# Patient Record
Sex: Female | Born: 1965 | Race: Black or African American | Hispanic: No | Marital: Single | Smoking: Never smoker
Health system: Southern US, Community
[De-identification: ages and names within clinical notes are randomized; demographics above are authoritative.]

## PROBLEM LIST (undated history)

## (undated) DIAGNOSIS — T7840XA Allergy, unspecified, initial encounter: Secondary | ICD-10-CM

## (undated) DIAGNOSIS — K219 Gastro-esophageal reflux disease without esophagitis: Secondary | ICD-10-CM

## (undated) HISTORY — PX: CHOLECYSTECTOMY: SHX55

## (undated) HISTORY — PX: GASTRIC BYPASS: SHX52

## (undated) HISTORY — PX: TUBAL LIGATION: SHX77

## (undated) HISTORY — PX: GASTRECTOMY: SHX58

## (undated) HISTORY — PX: ABDOMINAL HYSTERECTOMY: SHX81

## (undated) HISTORY — DX: Gastro-esophageal reflux disease without esophagitis: K21.9

## (undated) HISTORY — DX: Allergy, unspecified, initial encounter: T78.40XA

---

## 1997-11-05 ENCOUNTER — Emergency Department (HOSPITAL_COMMUNITY): Admission: EM | Admit: 1997-11-05 | Discharge: 1997-11-05 | Payer: Self-pay | Admitting: Emergency Medicine

## 1998-09-01 ENCOUNTER — Emergency Department (HOSPITAL_COMMUNITY): Admission: EM | Admit: 1998-09-01 | Discharge: 1998-09-01 | Payer: Self-pay | Admitting: Emergency Medicine

## 1998-09-01 ENCOUNTER — Encounter: Payer: Self-pay | Admitting: Emergency Medicine

## 2004-01-09 ENCOUNTER — Encounter (INDEPENDENT_AMBULATORY_CARE_PROVIDER_SITE_OTHER): Payer: Self-pay | Admitting: Internal Medicine

## 2004-01-19 ENCOUNTER — Ambulatory Visit (HOSPITAL_BASED_OUTPATIENT_CLINIC_OR_DEPARTMENT_OTHER): Admission: RE | Admit: 2004-01-19 | Discharge: 2004-01-19 | Payer: Self-pay | Admitting: Urology

## 2004-01-20 ENCOUNTER — Emergency Department (HOSPITAL_COMMUNITY): Admission: EM | Admit: 2004-01-20 | Discharge: 2004-01-21 | Payer: Self-pay | Admitting: Emergency Medicine

## 2004-10-14 ENCOUNTER — Inpatient Hospital Stay (HOSPITAL_COMMUNITY): Admission: AD | Admit: 2004-10-14 | Discharge: 2004-10-14 | Payer: Self-pay | Admitting: Obstetrics and Gynecology

## 2005-01-30 ENCOUNTER — Encounter: Admission: RE | Admit: 2005-01-30 | Discharge: 2005-01-30 | Payer: Self-pay | Admitting: General Surgery

## 2005-04-08 ENCOUNTER — Ambulatory Visit (HOSPITAL_COMMUNITY): Admission: RE | Admit: 2005-04-08 | Discharge: 2005-04-08 | Payer: Self-pay | Admitting: Surgery

## 2005-05-08 ENCOUNTER — Encounter: Admission: RE | Admit: 2005-05-08 | Discharge: 2005-08-06 | Payer: Self-pay | Admitting: Surgery

## 2005-05-13 ENCOUNTER — Ambulatory Visit (HOSPITAL_COMMUNITY): Admission: RE | Admit: 2005-05-13 | Discharge: 2005-05-13 | Payer: Self-pay | Admitting: Surgery

## 2005-06-26 ENCOUNTER — Emergency Department (HOSPITAL_COMMUNITY): Admission: EM | Admit: 2005-06-26 | Discharge: 2005-06-26 | Payer: Self-pay | Admitting: Emergency Medicine

## 2005-09-01 ENCOUNTER — Ambulatory Visit (HOSPITAL_COMMUNITY): Admission: RE | Admit: 2005-09-01 | Discharge: 2005-09-02 | Payer: Self-pay | Admitting: Surgery

## 2005-10-24 ENCOUNTER — Encounter: Admission: RE | Admit: 2005-10-24 | Discharge: 2005-10-24 | Payer: Self-pay | Admitting: Surgery

## 2006-02-02 ENCOUNTER — Encounter: Admission: RE | Admit: 2006-02-02 | Discharge: 2006-02-02 | Payer: Self-pay | Admitting: Surgery

## 2006-02-18 ENCOUNTER — Encounter: Admission: RE | Admit: 2006-02-18 | Discharge: 2006-02-18 | Payer: Self-pay | Admitting: Surgery

## 2007-09-06 ENCOUNTER — Emergency Department (HOSPITAL_COMMUNITY): Admission: EM | Admit: 2007-09-06 | Discharge: 2007-09-06 | Payer: Self-pay | Admitting: Emergency Medicine

## 2007-09-27 ENCOUNTER — Emergency Department (HOSPITAL_COMMUNITY): Admission: EM | Admit: 2007-09-27 | Discharge: 2007-09-27 | Payer: Self-pay | Admitting: Emergency Medicine

## 2008-01-26 ENCOUNTER — Ambulatory Visit: Payer: Self-pay | Admitting: Nurse Practitioner

## 2008-01-26 DIAGNOSIS — J309 Allergic rhinitis, unspecified: Secondary | ICD-10-CM | POA: Insufficient documentation

## 2008-01-27 ENCOUNTER — Encounter (INDEPENDENT_AMBULATORY_CARE_PROVIDER_SITE_OTHER): Payer: Self-pay | Admitting: Nurse Practitioner

## 2008-02-15 ENCOUNTER — Encounter (INDEPENDENT_AMBULATORY_CARE_PROVIDER_SITE_OTHER): Payer: Self-pay | Admitting: Nurse Practitioner

## 2008-02-25 ENCOUNTER — Ambulatory Visit: Payer: Self-pay | Admitting: Nurse Practitioner

## 2008-02-25 ENCOUNTER — Encounter (INDEPENDENT_AMBULATORY_CARE_PROVIDER_SITE_OTHER): Payer: Self-pay | Admitting: Nurse Practitioner

## 2008-02-25 DIAGNOSIS — N289 Disorder of kidney and ureter, unspecified: Secondary | ICD-10-CM | POA: Insufficient documentation

## 2008-02-25 DIAGNOSIS — R109 Unspecified abdominal pain: Secondary | ICD-10-CM | POA: Insufficient documentation

## 2008-02-25 LAB — CONVERTED CEMR LAB

## 2008-02-26 ENCOUNTER — Encounter (INDEPENDENT_AMBULATORY_CARE_PROVIDER_SITE_OTHER): Payer: Self-pay | Admitting: Nurse Practitioner

## 2008-02-28 ENCOUNTER — Encounter (INDEPENDENT_AMBULATORY_CARE_PROVIDER_SITE_OTHER): Payer: Self-pay | Admitting: Nurse Practitioner

## 2008-02-29 ENCOUNTER — Ambulatory Visit (HOSPITAL_COMMUNITY): Admission: RE | Admit: 2008-02-29 | Discharge: 2008-02-29 | Payer: Self-pay | Admitting: Internal Medicine

## 2008-03-08 ENCOUNTER — Ambulatory Visit (HOSPITAL_COMMUNITY): Admission: RE | Admit: 2008-03-08 | Discharge: 2008-03-08 | Payer: Self-pay | Admitting: Internal Medicine

## 2008-03-16 ENCOUNTER — Encounter: Admission: RE | Admit: 2008-03-16 | Discharge: 2008-03-16 | Payer: Self-pay | Admitting: Internal Medicine

## 2008-03-16 ENCOUNTER — Encounter (INDEPENDENT_AMBULATORY_CARE_PROVIDER_SITE_OTHER): Payer: Self-pay | Admitting: Internal Medicine

## 2008-04-30 ENCOUNTER — Emergency Department (HOSPITAL_COMMUNITY): Admission: EM | Admit: 2008-04-30 | Discharge: 2008-04-30 | Payer: Self-pay | Admitting: Emergency Medicine

## 2008-06-01 ENCOUNTER — Ambulatory Visit: Payer: Self-pay | Admitting: Nurse Practitioner

## 2008-06-02 ENCOUNTER — Encounter (INDEPENDENT_AMBULATORY_CARE_PROVIDER_SITE_OTHER): Payer: Self-pay | Admitting: Nurse Practitioner

## 2008-08-13 ENCOUNTER — Emergency Department (HOSPITAL_COMMUNITY): Admission: EM | Admit: 2008-08-13 | Discharge: 2008-08-13 | Payer: Self-pay | Admitting: Emergency Medicine

## 2009-02-24 ENCOUNTER — Emergency Department (HOSPITAL_COMMUNITY): Admission: EM | Admit: 2009-02-24 | Discharge: 2009-02-24 | Payer: Self-pay | Admitting: Emergency Medicine

## 2009-03-12 ENCOUNTER — Encounter: Admission: RE | Admit: 2009-03-12 | Discharge: 2009-03-12 | Payer: Self-pay | Admitting: *Deleted

## 2009-03-23 ENCOUNTER — Encounter: Admission: RE | Admit: 2009-03-23 | Discharge: 2009-03-23 | Payer: Self-pay | Admitting: *Deleted

## 2009-03-25 ENCOUNTER — Encounter: Admission: RE | Admit: 2009-03-25 | Discharge: 2009-03-25 | Payer: Self-pay | Admitting: *Deleted

## 2009-03-26 ENCOUNTER — Encounter: Admission: RE | Admit: 2009-03-26 | Discharge: 2009-03-26 | Payer: Self-pay | Admitting: *Deleted

## 2009-04-04 ENCOUNTER — Encounter: Admission: RE | Admit: 2009-04-04 | Discharge: 2009-04-04 | Payer: Self-pay | Admitting: *Deleted

## 2009-07-07 ENCOUNTER — Ambulatory Visit (HOSPITAL_COMMUNITY): Admission: RE | Admit: 2009-07-07 | Discharge: 2009-07-07 | Payer: Self-pay | Admitting: Family Medicine

## 2010-04-07 ENCOUNTER — Encounter: Payer: Self-pay | Admitting: *Deleted

## 2010-04-14 LAB — CONVERTED CEMR LAB
ALT: 13 units/L (ref 0–35)
Albumin: 3.7 g/dL (ref 3.5–5.2)
Basophils Absolute: 0 10*3/uL (ref 0.0–0.1)
Blood in Urine, dipstick: NEGATIVE
CO2: 26 meq/L (ref 19–32)
Calcium: 8.8 mg/dL (ref 8.4–10.5)
Chlamydia, DNA Probe: NEGATIVE
Chloride: 107 meq/L (ref 96–112)
Cholesterol: 164 mg/dL (ref 0–200)
Creatinine, Ser: 0.74 mg/dL (ref 0.40–1.20)
GC Probe Amp, Genital: NEGATIVE
Hemoglobin: 13.1 g/dL (ref 12.0–15.0)
Lymphocytes Relative: 35 % (ref 12–46)
Monocytes Absolute: 1.1 10*3/uL — ABNORMAL HIGH (ref 0.1–1.0)
Monocytes Relative: 16 % — ABNORMAL HIGH (ref 3–12)
Neutro Abs: 3.2 10*3/uL (ref 1.7–7.7)
Nitrite: NEGATIVE
Potassium: 4.5 meq/L (ref 3.5–5.3)
Protein, U semiquant: NEGATIVE
RBC: 4.37 M/uL (ref 3.87–5.11)
RDW: 13.5 % (ref 11.5–15.5)
Total CHOL/HDL Ratio: 3.2
WBC Urine, dipstick: NEGATIVE

## 2010-05-21 ENCOUNTER — Emergency Department (HOSPITAL_COMMUNITY)
Admission: EM | Admit: 2010-05-21 | Discharge: 2010-05-21 | Disposition: A | Discharge status: Home / Self Care | Payer: BC Managed Care – PPO | Attending: Emergency Medicine | Admitting: Emergency Medicine

## 2010-05-21 DIAGNOSIS — R51 Headache: Secondary | ICD-10-CM | POA: Insufficient documentation

## 2010-05-21 DIAGNOSIS — R110 Nausea: Secondary | ICD-10-CM | POA: Insufficient documentation

## 2010-06-18 LAB — COMPREHENSIVE METABOLIC PANEL
ALT: 17 U/L (ref 0–35)
AST: 21 U/L (ref 0–37)
Albumin: 4 g/dL (ref 3.5–5.2)
CO2: 28 mEq/L (ref 19–32)
Chloride: 103 mEq/L (ref 96–112)
GFR calc Af Amer: >60 mL/min (ref >60)
GFR calc non Af Amer: >60 mL/min (ref >60)
Potassium: 3.4 mEq/L — ABNORMAL LOW (ref 3.5–5.1)
Sodium: 136 mEq/L (ref 135–145)
Total Bilirubin: 0.6 mg/dL (ref 0.3–1.2)

## 2010-06-18 LAB — URINALYSIS, ROUTINE W REFLEX MICROSCOPIC
Bilirubin Urine: NEGATIVE
Hgb urine dipstick: NEGATIVE
Ketones, ur: NEGATIVE mg/dL
Nitrite: NEGATIVE
Specific Gravity, Urine: 1.009 (ref 1.005–1.030)
Urobilinogen, UA: 0.2 mg/dL (ref 0.0–1.0)
pH: 7 (ref 5.0–8.0)

## 2010-06-18 LAB — DIFFERENTIAL
Basophils Absolute: 0 10*3/uL (ref 0.0–0.1)
Eosinophils Absolute: 0 10*3/uL (ref 0.0–0.7)
Eosinophils Relative: 1 % (ref 0–5)
Monocytes Absolute: 0.8 10*3/uL (ref 0.1–1.0)

## 2010-06-18 LAB — LIPASE, BLOOD: Lipase: 16 U/L (ref 11–59)

## 2010-06-18 LAB — CBC
Platelets: 259 10*3/uL (ref 150–400)
RBC: 4.57 MIL/uL (ref 3.87–5.11)
WBC: 8.3 10*3/uL (ref 4.0–10.5)

## 2010-06-25 LAB — CBC
Hemoglobin: 14.3 g/dL (ref 12.0–15.0)
MCHC: 34 g/dL (ref 30.0–36.0)
MCV: 89.7 fL (ref 78.0–100.0)
RBC: 4.68 MIL/uL (ref 3.87–5.11)
RDW: 12.9 % (ref 11.5–15.5)

## 2010-06-25 LAB — DIFFERENTIAL
Lymphocytes Relative: 31 % (ref 12–46)
Lymphs Abs: 2.7 10*3/uL (ref 0.7–4.0)
Monocytes Relative: 11 % (ref 3–12)
Neutro Abs: 4.9 10*3/uL (ref 1.7–7.7)
Neutrophils Relative %: 57 % (ref 43–77)

## 2010-06-25 LAB — COMPREHENSIVE METABOLIC PANEL
CO2: 27 mEq/L (ref 19–32)
Calcium: 9.6 mg/dL (ref 8.4–10.5)
Creatinine, Ser: 0.65 mg/dL (ref 0.4–1.2)
GFR calc non Af Amer: >60 mL/min (ref >60)
Glucose, Bld: 77 mg/dL (ref 70–99)
Sodium: 139 mEq/L (ref 135–145)
Total Protein: 8 g/dL (ref 6.0–8.3)

## 2010-07-30 NOTE — Consult Note (Signed)
NAME:  Jackie Ellis, Jackie Ellis NO.:  [id number]   MEDICAL RECORD NO.:  [id number]          PATIENT TYPE:  EMS   LOCATION:  ED                           FACILITY:  [facility]   PHYSICIAN:  Juanetta Gosling, MDDATE OF BIRTH:  1965/08/08   DATE OF CONSULTATION:  08/13/2008  DATE OF DISCHARGE:                                 CONSULTATION   REFERRING PHYSICIAN:  Hilario Quarry, MD.   CHIEF COMPLAINT:  Nausea and vomiting.   HISTORY OF PRESENT ILLNESS:  This is a 45 year old female status post  Lap-Band by Dr. Ovidio Kin on September 01, 2005, with good results from  the standpoint of her morbid obesity.  She had her last fill in the  office by Dr. Ezzard Standing about 2 or 3 months ago, was doing well until about  5 days ago.  Since then, she has been unable to eat any solid food and  is only taking sips of liquids currently.  She came in today just  because of her inability to take any volume.  She has also had some  nausea and emesis associated with this.  She is having bowel movements,  passing gas.  Denies any abdominal pain.  Denies any fevers.   PAST MEDICAL HISTORY:  Negative.   PAST SURGICAL HISTORY:  1. Lap-Band.  2. Left ureteral dilatation.   MEDICATIONS:  Claritin.   ALLERGIES:  1. SULFA.  2. CODEINE.  3. PENICILLIN.   PHYSICAL EXAMINATION:  She is afebrile.  She is a well-appearing female  in no distress.  ABDOMEN:  Soft, nontender, nondistended.  Her wounds were all healed  without any evidence of any infectious, her port is palpable.  She has  KUB that shows the band to be in good position, no evidence of any free  air, no other abnormalities.   LABS:  Show a white blood cell count of 8.6, hematocrit 41.9, platelets  of 257, BUN and creatinine are 7 and 0.65, glucose 77.   ASSESSMENT:  Nausea and vomiting status post Lap-Band.   PLAN:  She has not had any antecedent viral illness or anything that  would explain why she might not be tolerating any real oral  intake at  this point.  She has no evidence of an infection, with a normal white  count and afebrile.  She does not have any evidence of band slippage  either, and her port is without any evidence of infection.  She is  really  not currently dehydrated and she feels well now.  I discussed with her  removing some fluid from her band and, under sterile technique, I  removed 3 mL from her port.  She tolerated this well and we will send  her home, on clear liquids, to follow up with Dr. Ezzard Standing or the  bariatric clinic sometime this week.      Juanetta Gosling, MD  Electronically Signed     MCW/MEDQ  D:  08/13/2008  T:  08/13/2008  Job:  [id number]

## 2010-08-02 NOTE — Op Note (Signed)
NAME:  Jackie Ellis, Jackie Ellis             ACCOUNT NO.:  [id number]   MEDICAL RECORD NO.:  [id number]          PATIENT TYPE:  AMB   LOCATION:  ENDO                         FACILITY:  [facility]   PHYSICIAN:  Sandria Bales. Ezzard Standing, M.D.  DATE OF BIRTH:  [birth year]   DATE OF PROCEDURE:  04/08/2005  DATE OF DISCHARGE:                                 OPERATIVE REPORT   CCS NUMBER:  [id number].   PREOPERATIVE DIAGNOSIS:  Constipation, blood in stool.   POSTOPERATIVE DIAGNOSIS:  No obvious mucosal lesion of colon with very  minimal internal hemorrhoidal disease.   PROCEDURE:  Flexible colonoscopy.   SURGEON:  Sandria Bales. Ezzard Standing, M.D.   ANESTHESIA:  Demerol 50 mg, Versed 5 mg.   COMPLICATIONS:  None.   INDICATIONS FOR PROCEDURE:  [name] is a 45 year old black female who has  had increasingly severe constipation and noticed blood in her stool.  Now  comes in for a screening colonoscopy.  She completed a mechanical bowel prep  at home.  She understands the indications and potential complications of the  procedure.  Complications include but are not limited to perforation and  bleeding.   PROCEDURAL NOTE:  With the patient in the left lateral decubitus position,  had an IV in her right wrist.  She was monitored with a pulse oximeter, EKG,  and blood pressure cuff and was on 2 liters nasal O2 during the procedure.  She was given initially 50 mg of Demerol and Versed 4 mg.  She was given an  additional 1 mg of Versed during the procedure.   A flexible Olympus endoscope was passed without difficulty around to her  cecum.  The appendiceal stump was identified.  The ileocecal valve was  identified, as was the fold of the ileocecal valve.  Her right colon,  transverse colon, left colon, sigmoid colon, were actually all unremarkable  for any mucosal lesion or mass.  Subsequently, no diverticular disease.  Within the rectal vault, I did retrospect the scope.  She may have some very  minimal internal hemorrhoids.   Actually, the overall view of the colon was  just totally normal.   I would not recommend another colonoscopy until she turns 50, unless she  should have some change or new symptoms.  She tolerated the procedure well.  Her father was not at the bedside at the end of the procedure.      Sandria Bales. Ezzard Standing, M.D.  Electronically Signed     DHN/MEDQ  D:  04/08/2005  T:  04/08/2005  Job:  [id number]

## 2010-08-02 NOTE — Op Note (Signed)
NAME:  Jackie Ellis             ACCOUNT NO.:  [id number]   MEDICAL RECORD NO.:  [id number]          PATIENT TYPE:  OIB   LOCATION:  1503                         FACILITY:  [facility]   PHYSICIAN:  Sandria Bales. Ezzard Standing, M.D.  DATE OF BIRTH:  11/04/1964   DATE OF PROCEDURE:  09/01/2005  DATE OF DISCHARGE:                                 OPERATIVE REPORT   PREOPERATIVE DIAGNOSIS:  Morbid obesity with a weight of 243, BMI 40.5.   POSTOPERATIVE DIAGNOSIS:  Morbid obesity with a weight of 243, BMI 40.5.   PROCEDURE:  Laparoscopic band procedure with a #10 laparoscopy band.   SURGEON:  Sandria Bales. Ezzard Standing, M.D.   ASSISTANT:  Sharlet Salina T. Hoxworth, M.D.   ANESTHESIA:  General endotracheal.   ESTIMATED BLOOD LOSS:  Minimal.   INDICATIONS FOR PROCEDURE:  [name] is a 45 year old black female who has  been through our bariatric preoperative program which has included medical  evaluation, lab evaluation, psychiatric evaluation, and nutritional  evaluation.  She is interested in a laparoscopic banding procedure and now  comes for laparoscopic banding.   The indications and potential complications of the lap band are explained to  the patient.  Potential complications include, but are not limited to,  bleeding, infection, slippage or erosion of the band, and longterm  nutritional consequences.   DESCRIPTION OF PROCEDURE:  The patient was placed in supine position.  She  was given IV antibiotics preoperatively.  Her abdomen was prepped with  Betadine solution and sterilely draped.   I went through a left subcostal incision and got into the abdominal cavity  with a 10 mm Optiview trocar.  I then placed a left lateral 5 mm trocar, a  left paramedian 10 mm trocar, a right paramedian 10 mm trocar, and a right  subcostal 12 mm trocar, and a 5 mm trocar in her subxiphoid location.   Abdominal exploration revealed right and left lobes of the liver  unremarkable, gallbladder unremarkable, anterior wall  of the stomach  unremarkable.  The bowel which I could see was unremarkable.   I positioned the Nathanson retractor under the left lobe of the liver and  identified the gastroesophageal junction.  I first opened up the angle of  Hiss, and then went under the lesser curvature and found where the left and  right crura and passed the finger dissector behind the esophageal gastric  junction.   This passed without difficulty.  I then had them pass the lap band sizer  down and this was passed into the stomach without difficulty.  The lap band  sizer was inflated to 15 mL and pulled back snugly at the GE junction.  There was no evidence of hiatal hernia either externally or where the sizer  pulled back.   I then cinched the band down, removed the sizer without difficulty, and I  imbricated the stomach over the band using three separate sutures along the  gastrogastric plication.  I used 2-0 Ethibond suture and used the laparotie  on the stitch.   After three stitches, I felt we imbricated the band well.  It looked  like it  was in good location.  I did take photos of it.  There was no  bleeding  around the band.  The band tubing was removed through the right paramedian  incision.  The trocars were then all removed in turn.  There was no bleeding  at any trocar site.   I then enlarged the incision where the trocar came on the right paramedian  incision.  I sewed the reservoir down to the fascia using interrupted 2-0  Prolene sutures.  I attached the band to the Silastic tubing device to the  reservoir and inserted this into the abdominal cavity.   The band lay flat.  The tubing went well into the abdomen.  I then closed  the subcutaneous tissues with 3-0 Vicryl and the skin at each site with a 5-  0 Monocryl suture.  I painted the wound with tincture of Benzoin and Steri-  Strips.   The patient tolerated the procedure well.  Estimated blood loss was minimal.  Needle, sponge, and  instrument counts correct at the end of the case.   I used a #10 band in.  The reference number was [id number].  The S/N number was  [id number] and this was a laparoscopic adjustable gastric band.      Sandria Bales. Ezzard Standing, M.D.  Electronically Signed     DHN/MEDQ  D:  09/01/2005  T:  09/02/2005  Job:  [id number]   cc:   Jonita Albee, M.D.  Fax: [phone]

## 2010-08-02 NOTE — Op Note (Signed)
NAME:  Jackie Ellis, ARTS             ACCOUNT NO.:  [id number]   MEDICAL RECORD NO.:  [id number]          PATIENT TYPE:  AMB   LOCATION:  NESC                         FACILITY:  [facility]   PHYSICIAN:  Maretta Bees. Vonita Moss, M.D.DATE OF BIRTH:  10-Sep-1965   DATE OF PROCEDURE:  01/19/2004  DATE OF DISCHARGE:                                 OPERATIVE REPORT   PREOPERATIVE DIAGNOSES:  1.  Left lower quadrant pain.  2.  Left hydronephrosis.  3.  Suspected left ureteral calculus.   POSTOPERATIVE DIAGNOSES:  1.  Left lower quadrant pain.  2.  Left hydronephrosis.  3.  Mild distal ureteral stricture.   PROCEDURES:  1.  Cystoscopy.  2.  Left retrograde pyelogram with interpretation.  3.  Left ureteroscopy.  4.  Balloon dilation of left ureteral stricture.  5.  Placement of left double-J catheter.   SURGEON:  Dr. Larey Dresser   ANESTHESIA:  General.   INDICATIONS:  This 45 year old black female has had 6 weeks of left lower  quadrant pain and had a CT scan that showed mild left ureteral obstruction  and question of a small distal left ureteral calculus.  No other  abnormalities were seen on this abdominal pelvic CT scan done at  [facility].  She is brought to the OR today because she has had  6 weeks of pain and requests intervention.  I talked to her about  cystoscopy, ureteroscopy, and stone manipulation.   DESCRIPTION OF PROCEDURE:  The patient was brought to the operating room and  placed in lithotomy position.  External genitalia were prepped and draped in  the usual fashion.  She was cystoscoped, and the bladder was normal.  The  ureteral orifices looked on the small side.  A guidewire was placed up the  distal left ureter and adjacent to that, I saw a calcification that seemed  to be close and moved with the wire, but I was not convinced it was a stone.  I had to do a balloon dilation at the left intramural ureter to allow  ureteroscopy with a 6 French rigid  ureteroscope.  Just below the level of  the iliac vessels, there was a narrow area of the ureter that I could not  negotiate with the ureteroscope, so I did a balloon dilation for 3 minutes  at 12 atmospheres of pressure of this area, and that allowed the  ureteroscope to bypass this area without difficulty and observe the ureter  all the way to the total length of the scope with no evidence of stones or  other lesions.  Left retrograde pyelogram showed some mild left  hydronephrosis.  I looked my way out with the ureteroscope, and there seemed  to be very minimal disruption of the ureteral mucosa, and I was thinking  that double-J catheter would not be necessary, but then I injected contrast  while pulling out an open-ended ureteral catheter, and there was some  periureteral extravasation near the tight area that I dilated in the pelvic  region.  Therefore, I re-placed a metal guidewire into the renal collecting  system without difficulty and  over the guidewire placed a 6 French 26 cm  double-J catheter with the string off that coiled fully in the renal  collecting system and a full coil in the bladder.  The bladder was emptied,  scope removed, and the patient sent to the recovery room in good condition  having tolerated the procedure well.  She was given a B&O suppository and 30  mg of Toradol IV and will be sent home on pain medications, Detrol LA as  needed, and 2 more days of Levaquin.      LJP/MEDQ  D:  01/19/2004  T:  01/19/2004  Job:  [id number]   cc:   Jonita Albee, M.D.  [facility]  [address]  [place]  [place] [address]  Fax: [phone]

## 2010-12-12 LAB — RAPID STREP SCREEN (MED CTR MEBANE ONLY): Streptococcus, Group A Screen (Direct): NEGATIVE

## 2011-06-08 IMAGING — CR DG ABDOMEN 2V
2 series · 2 of 2 positions shown · non-contrast
Comparison: 08/13/2008.

CLINICAL DATA: Nausea and vomiting.  Epigastric pain.  Gastric lap
banding 3 years ago.

ABDOMEN - 2 VIEW

[w abdomen upright]
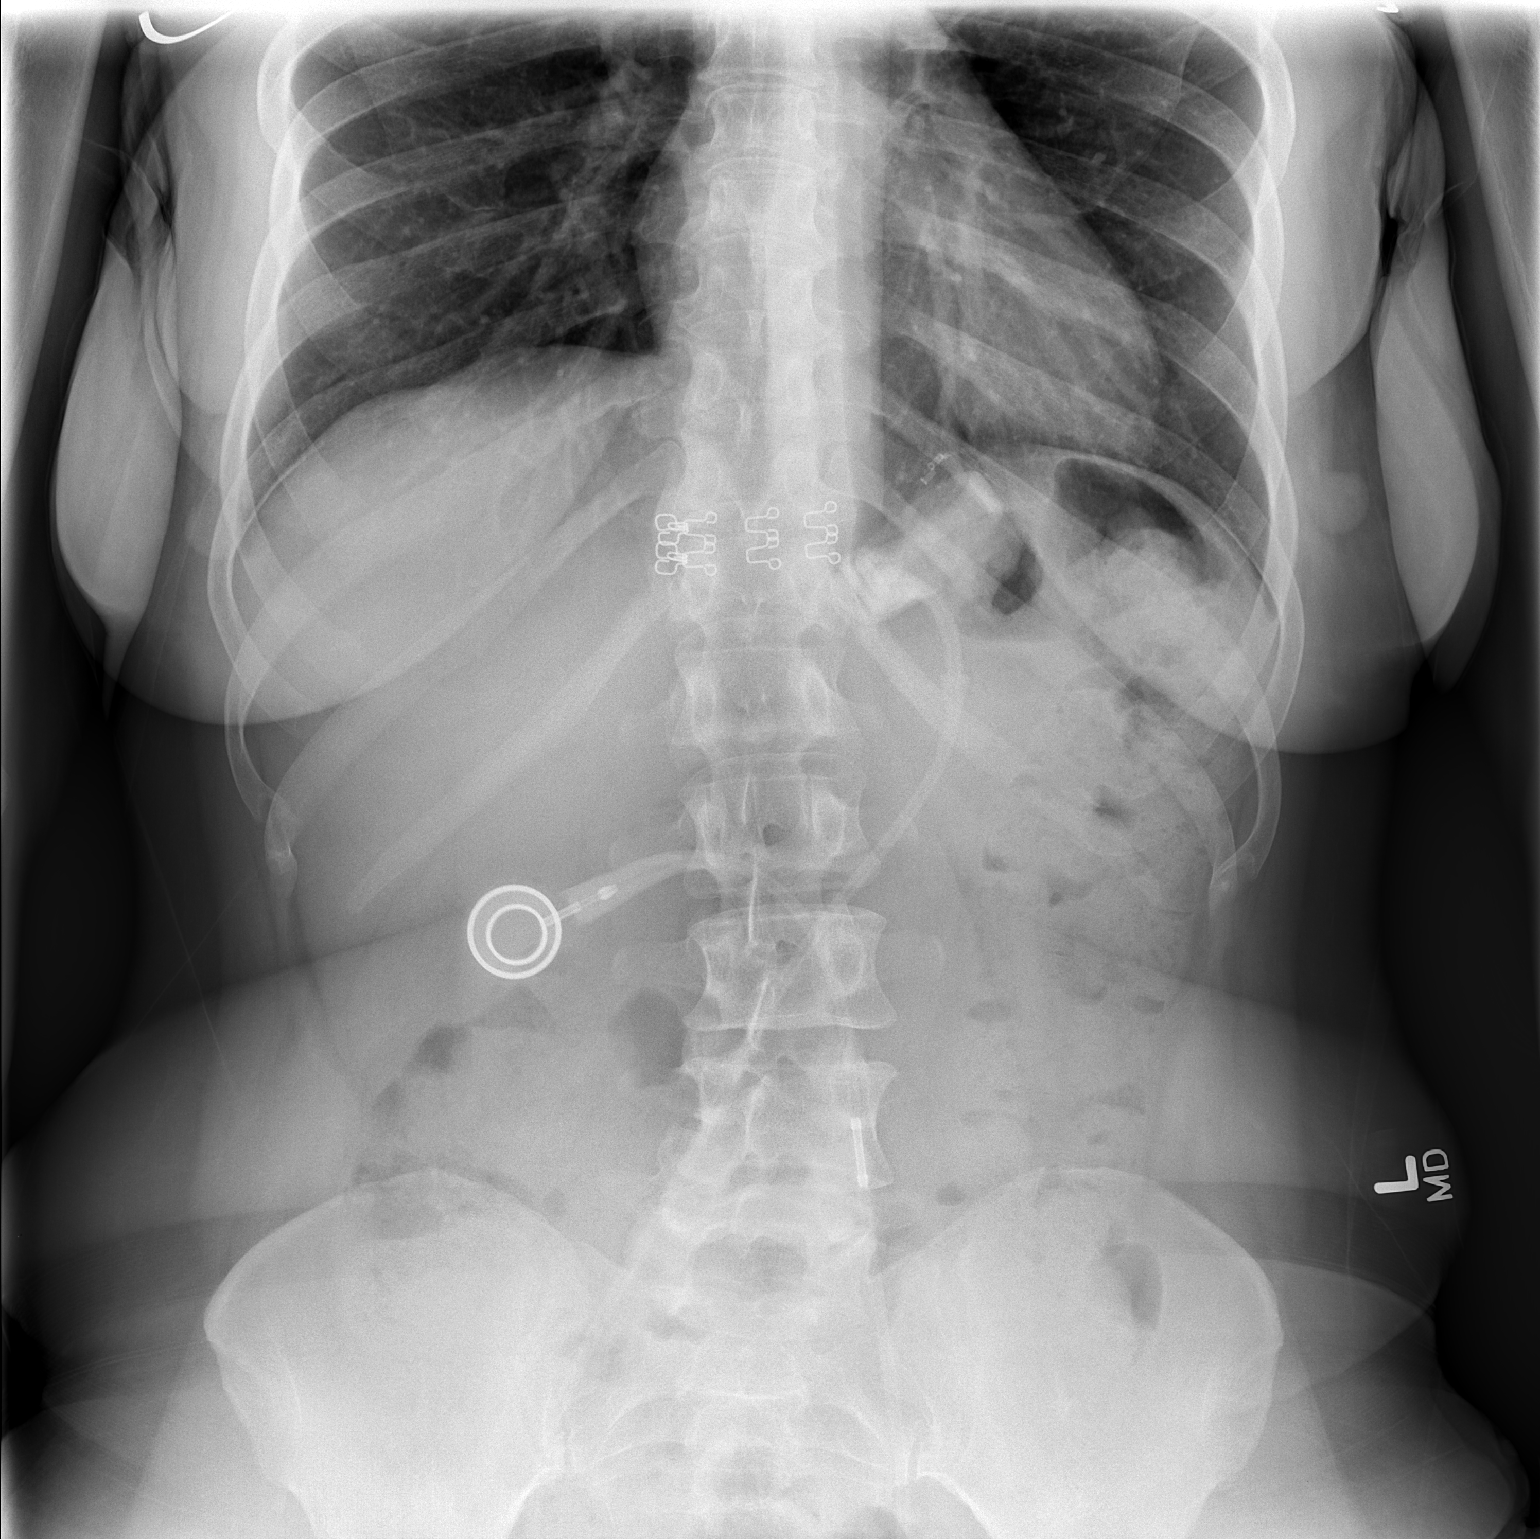

[t abdomen supine]
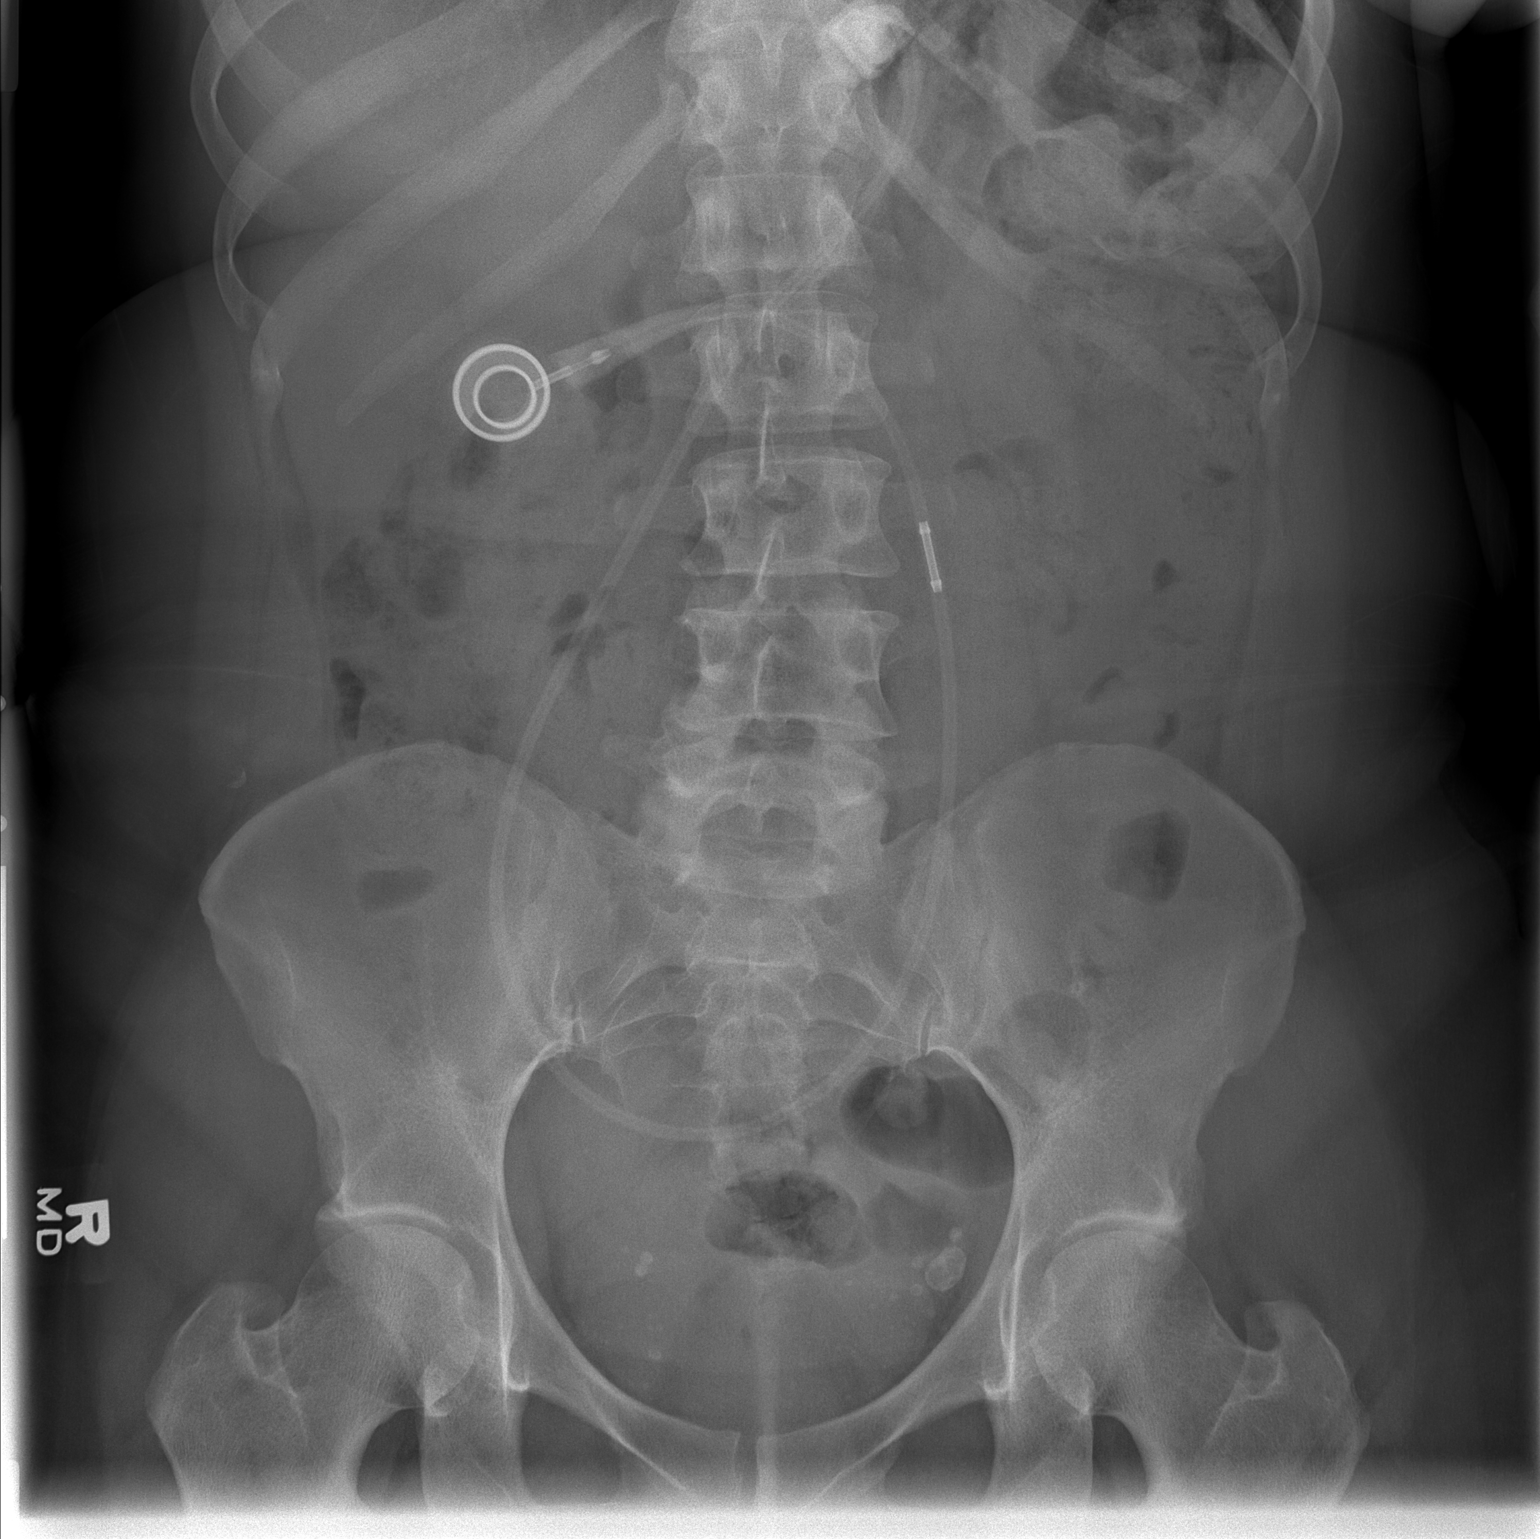

[2 of 2 positions shown; findings below may reference images not displayed]

FINDINGS: Lap band position and orientation are unchanged.  The
tubing is intact.  The bowel gas pattern is normal.  Stool is
present throughout the colon.  There is no free intraperitoneal
air.  Pelvic phleboliths appear stable.
IMPRESSION: No active abdominal process.  Lap band appears stable.

## 2012-03-08 DIAGNOSIS — M84376A Stress fracture, unspecified foot, initial encounter for fracture: Secondary | ICD-10-CM | POA: Insufficient documentation

## 2015-03-20 DIAGNOSIS — F419 Anxiety disorder, unspecified: Secondary | ICD-10-CM | POA: Insufficient documentation

## 2015-04-23 ENCOUNTER — Ambulatory Visit (INDEPENDENT_AMBULATORY_CARE_PROVIDER_SITE_OTHER): Payer: BLUE CROSS/BLUE SHIELD | Admitting: Physician Assistant

## 2015-04-23 VITALS — BP 124/88 | HR 89 | Temp 99.4°F | Resp 18 | Ht 65.0 in | Wt 262.2 lb

## 2015-04-23 DIAGNOSIS — J0141 Acute recurrent pansinusitis: Secondary | ICD-10-CM | POA: Diagnosis not present

## 2015-04-23 DIAGNOSIS — J0140 Acute pansinusitis, unspecified: Secondary | ICD-10-CM | POA: Diagnosis not present

## 2015-04-23 MED ORDER — FLUCONAZOLE 150 MG PO TABS: 150.0000 mg | ORAL_TABLET | Freq: Once | ORAL | Status: DC

## 2015-04-23 MED ORDER — OXYMETAZOLINE HCL 0.05 % NA SOLN: 1.0000 | Freq: Two times a day (BID) | NASAL | Status: DC

## 2015-04-23 MED ORDER — GUAIFENESIN ER 1200 MG PO TB12: 1.0000 | ORAL_TABLET | Freq: Two times a day (BID) | ORAL | Status: DC | PRN

## 2015-04-23 MED ORDER — LEVOFLOXACIN 750 MG PO TABS: 750.0000 mg | ORAL_TABLET | Freq: Every day | ORAL | Status: DC

## 2015-04-23 MED ORDER — HYDROCOD POLST-CPM POLST ER 10-8 MG/5ML PO SUER: 5.0000 mL | Freq: Every evening | ORAL | Status: DC | PRN

## 2015-04-23 NOTE — Progress Notes (Signed)
[facility] [address], [place] [address] [id number]  Date:  04/23/2015   Name:  Jackie Ellis   DOB:  16-Jan-1966   MRN:  [id number]  PCP:  No primary care provider on file.   History of Present Illness:  Jackie Ellis is a 50 y.o. female patient who presents to [facility] for cc of body aches, nasal congestion, and cough. Patient reports sinus congestion with facial pain for about 3 days.  She became concerned when she had fever of 102 this morning at 4 am.  She took tylenol which helped.  She has aching and headache.  Dry cough.  She has nasal congestion with thick yellow green mucus.  Facial pain.  No nose bleeds.  Sore throat.  She is drinking a lot of water.   No sob or dyspnea. She attempted Nyquil and tylenol.  She did Flonase and mucinex this morning as well.   This is the 4th sinus infection since 4 months ago.  She was seen at ENT who gave her a cephalosporin and prednisone on separate occasions.  She has had fever in the past with this infection as well.  She was advised to have a sinuplasty, but she is reluctant.  She just moved back to [place], and does not have a specialist here.   There are no active problems to display for this patient.   Past Medical History  Diagnosis Date  . Allergy   . GERD (gastroesophageal reflux disease)     Past Surgical History  Procedure Laterality Date  . Tubal ligation    . Abdominal hysterectomy    . Tubal ligation      Social History  Substance Use Topics  . Smoking status: Never Smoker   . Smokeless tobacco: None  . Alcohol Use: No    Family History  Problem Relation Age of Onset  . Diabetes Mother   . Hypertension Mother   . Diabetes Father   . Hypertension Father   . Cancer Sister   . Diabetes Brother   . Hypertension Brother   . Hyperlipidemia Brother     Allergies  Allergen Reactions  . Codeine Rash  . Penicillins Rash    Medication list has been reviewed and  updated.  No current outpatient prescriptions on file prior to visit.   No current facility-administered medications on file prior to visit.    ROS ROS otherwise unremarkable unless listed above.   Physical Examination: BP 124/88 mmHg  Pulse 89  Temp(Src) 99.4 F (37.4 C) (Oral)  Resp 18  Ht  (1.651 m)  Wt 262 lb 3.2 oz (118.933 kg)  BMI 43.63 kg/m2  SpO2 99% Ideal Body Weight: Weight in (lb) to have BMI = 25: 149.9  Physical Exam  Constitutional: She is oriented to person, place, and time. She appears well-developed and well-nourished. No distress.  HENT:  Head: Normocephalic and atraumatic.  Right Ear: Tympanic membrane, external ear and ear canal normal.  Left Ear: Tympanic membrane, external ear and ear canal normal.  Nose: Mucosal edema and rhinorrhea (purulent) present. Right sinus exhibits maxillary sinus tenderness and frontal sinus tenderness. Left sinus exhibits maxillary sinus tenderness and frontal sinus tenderness.  Mouth/Throat: No uvula swelling. No oropharyngeal exudate, posterior oropharyngeal edema or posterior oropharyngeal erythema.  Eyes: Conjunctivae and EOM are normal. Pupils are equal, round, and reactive to light.  Cardiovascular: Normal rate and regular rhythm.  Exam reveals no gallop, no distant heart sounds and no friction  rub.   No murmur heard. Pulmonary/Chest: Effort normal. No respiratory distress. She has no decreased breath sounds. She has no wheezes. She has no rhonchi.  Lymphadenopathy:       Head (right side): No submandibular, no tonsillar, no preauricular and no posterior auricular adenopathy present.       Head (left side): No submandibular, no tonsillar, no preauricular and no posterior auricular adenopathy present.    She has cervical adenopathy.       Right cervical: Superficial cervical adenopathy present.       Left cervical: Superficial cervical adenopathy present.  Neurological: She is alert and oriented to person, place, and  time.  Skin: She is not diaphoretic.  Psychiatric: She has a normal mood and affect. Her behavior is normal.     Assessment and Plan: Jackie Ellis is a 50 y.o. female who is here today for cc of sinus pain, fever, and cough.   --will guard for bacteria at this time.  This is recurrent with patient since October with 4 episodes and abx use.  She states that she generally is put on prednisone with abx.  With high fever, I am reluctant to do this.  Given Levaquin--and placed for ENT consult (thank you).    Subacute pansinusitis - Plan: chlorpheniramine-HYDROcodone (TUSSIONEX PENNKINETIC ER) 10-8 MG/5ML SUER, Guaifenesin (MUCINEX MAXIMUM STRENGTH) 1200 MG TB12, levofloxacin (LEVAQUIN) 750 MG tablet, fluconazole (DIFLUCAN) 150 MG tablet, oxymetazoline (AFRIN) 0.05 % nasal spray, Ambulatory referral to ENT  Acute recurrent pansinusitis - Plan: Ambulatory referral to ENT  Trena Platt, PA-C [facility] 2/6/20179:18 AM

## 2015-04-23 NOTE — Patient Instructions (Signed)
Please hydrate well with water 64 oz per day. Please take the afrin for only 3 days. Please await contact for ENT referral.   Sinusitis, Adult Sinusitis is redness, soreness, and inflammation of the paranasal sinuses. Paranasal sinuses are air pockets within the bones of your face. They are located beneath your eyes, in the middle of your forehead, and above your eyes. In healthy paranasal sinuses, mucus is able to drain out, and air is able to circulate through them by way of your nose. However, when your paranasal sinuses are inflamed, mucus and air can become trapped. This can allow bacteria and other germs to grow and cause infection. Sinusitis can develop quickly and last only a short time (acute) or continue over a long period (chronic). Sinusitis that lasts for more than 12 weeks is considered chronic. CAUSES Causes of sinusitis include:  Allergies.  Structural abnormalities, such as displacement of the cartilage that separates your nostrils (deviated septum), which can decrease the air flow through your nose and sinuses and affect sinus drainage.  Functional abnormalities, such as when the small hairs (cilia) that line your sinuses and help remove mucus do not work properly or are not present. SIGNS AND SYMPTOMS Symptoms of acute and chronic sinusitis are the same. The primary symptoms are pain and pressure around the affected sinuses. Other symptoms include:  Upper toothache.  Earache.  Headache.  Bad breath.  Decreased sense of smell and taste.  A cough, which worsens when you are lying flat.  Fatigue.  Fever.  Thick drainage from your nose, which often is green and may contain pus (purulent).  Swelling and warmth over the affected sinuses. DIAGNOSIS Your health care provider will perform a physical exam. During your exam, your health care provider may perform any of the following to help determine if you have acute sinusitis or chronic sinusitis:  Look in your nose  for signs of abnormal growths in your nostrils (nasal polyps).  Tap over the affected sinus to check for signs of infection.  View the inside of your sinuses using an imaging device that has a light attached (endoscope). If your health care provider suspects that you have chronic sinusitis, one or more of the following tests may be recommended:  Allergy tests.  Nasal culture. A sample of mucus is taken from your nose, sent to a lab, and screened for bacteria.  Nasal cytology. A sample of mucus is taken from your nose and examined by your health care provider to determine if your sinusitis is related to an allergy. TREATMENT Most cases of acute sinusitis are related to a viral infection and will resolve on their own within 10 days. Sometimes, medicines are prescribed to help relieve symptoms of both acute and chronic sinusitis. These may include pain medicines, decongestants, nasal steroid sprays, or saline sprays. However, for sinusitis related to a bacterial infection, your health care provider will prescribe antibiotic medicines. These are medicines that will help kill the bacteria causing the infection. Rarely, sinusitis is caused by a fungal infection. In these cases, your health care provider will prescribe antifungal medicine. For some cases of chronic sinusitis, surgery is needed. Generally, these are cases in which sinusitis recurs more than 3 times per year, despite other treatments. HOME CARE INSTRUCTIONS  Drink plenty of water. Water helps thin the mucus so your sinuses can drain more easily.  Use a humidifier.  Inhale steam 3-4 times a day (for example, sit in the bathroom with the shower running).  Apply a  warm, moist washcloth to your face 3-4 times a day, or as directed by your health care provider.  Use saline nasal sprays to help moisten and clean your sinuses.  Take medicines only as directed by your health care provider.  If you were prescribed either an antibiotic  or antifungal medicine, finish it all even if you start to feel better. SEEK IMMEDIATE MEDICAL CARE IF:  You have increasing pain or severe headaches.  You have nausea, vomiting, or drowsiness.  You have swelling around your face.  You have vision problems.  You have a stiff neck.  You have difficulty breathing.   This information is not intended to replace advice given to you by your health care provider. Make sure you discuss any questions you have with your health care provider.   Document Released: 03/03/2005 Document Revised: 03/24/2014 Document Reviewed: 03/18/2011 Elsevier Interactive Patient Education [employer].

## 2015-06-04 ENCOUNTER — Encounter (HOSPITAL_BASED_OUTPATIENT_CLINIC_OR_DEPARTMENT_OTHER): Payer: Self-pay | Admitting: Emergency Medicine

## 2015-06-04 ENCOUNTER — Emergency Department (HOSPITAL_BASED_OUTPATIENT_CLINIC_OR_DEPARTMENT_OTHER)
Admission: EM | Admit: 2015-06-04 | Discharge: 2015-06-05 | Disposition: A | Discharge status: Home / Self Care | Payer: BLUE CROSS/BLUE SHIELD | Attending: Emergency Medicine | Admitting: Emergency Medicine

## 2015-06-04 ENCOUNTER — Emergency Department (HOSPITAL_BASED_OUTPATIENT_CLINIC_OR_DEPARTMENT_OTHER): Payer: BLUE CROSS/BLUE SHIELD

## 2015-06-04 DIAGNOSIS — Z8719 Personal history of other diseases of the digestive system: Secondary | ICD-10-CM | POA: Insufficient documentation

## 2015-06-04 DIAGNOSIS — R0789 Other chest pain: Secondary | ICD-10-CM | POA: Diagnosis not present

## 2015-06-04 DIAGNOSIS — Z880 Allergy status to penicillin: Secondary | ICD-10-CM | POA: Diagnosis not present

## 2015-06-04 DIAGNOSIS — Z792 Long term (current) use of antibiotics: Secondary | ICD-10-CM | POA: Diagnosis not present

## 2015-06-04 DIAGNOSIS — R079 Chest pain, unspecified: Secondary | ICD-10-CM | POA: Diagnosis present

## 2015-06-04 LAB — CBC WITH DIFFERENTIAL/PLATELET
Basophils Absolute: 0 10*3/uL (ref 0.0–0.1)
Basophils Relative: 0 %
EOS ABS: 0.3 10*3/uL (ref 0.0–0.7)
Eosinophils Relative: 2 %
HEMATOCRIT: 35.4 % — AB (ref 36.0–46.0)
HEMOGLOBIN: 11.9 g/dL — AB (ref 12.0–15.0)
Lymphocytes Relative: 38 %
Lymphs Abs: 4.8 10*3/uL — ABNORMAL HIGH (ref 0.7–4.0)
MCH: 27.4 pg (ref 26.0–34.0)
MCHC: 33.6 g/dL (ref 30.0–36.0)
MCV: 81.4 fL (ref 78.0–100.0)
Monocytes Absolute: 1.5 10*3/uL — ABNORMAL HIGH (ref 0.1–1.0)
Monocytes Relative: 11 %
NEUTROS ABS: 6.3 10*3/uL (ref 1.7–7.7)
NEUTROS PCT: 49 %
Platelets: 327 10*3/uL (ref 150–400)
RBC: 4.35 MIL/uL (ref 3.87–5.11)
RDW: 13.7 % (ref 11.5–15.5)
WBC: 12.8 10*3/uL — ABNORMAL HIGH (ref 4.0–10.5)

## 2015-06-04 LAB — BASIC METABOLIC PANEL
Anion gap: 7 (ref 5–15)
BUN: 17 mg/dL (ref 6–20)
CHLORIDE: 105 mmol/L (ref 101–111)
CO2: 26 mmol/L (ref 22–32)
CREATININE: 0.77 mg/dL (ref 0.44–1.00)
Calcium: 9.5 mg/dL (ref 8.9–10.3)
GFR calc Af Amer: >60 mL/min (ref >60)
GFR calc non Af Amer: >60 mL/min (ref >60)
Glucose, Bld: 102 mg/dL — ABNORMAL HIGH (ref 65–99)
POTASSIUM: 3.3 mmol/L — AB (ref 3.5–5.1)
SODIUM: 138 mmol/L (ref 135–145)

## 2015-06-04 LAB — TROPONIN I

## 2015-06-04 NOTE — ED Provider Notes (Signed)
CSN: [id number]     Arrival date & time 06/04/15  2041 History  By signing my name below, I, Budd Palmer, attest that this documentation has been prepared under the direction and in the presence of Paula Libra, MD. Electronically Signed: Budd Palmer, ED Scribe. 06/04/2015. 11:20 PM.     Chief Complaint  Patient presents with  . Chest Pain   The history is provided by the patient. No language interpreter was used.   HPI Comments: Jackie Ellis is a 50 y.o. female who presents to the Emergency Department complaining of intermittent, left-sided chest pain onset 10 AM this morning, lasting for about a minute at a time. Pt states it feels as though she is having "spasms in [her] chest." She notes the pains first occurred every hour, but then increased to once every 20 minutes as of 5 PM this evening. She feels she has to catch her breath after a sharp intake with each episode. She reports associated upper-left chest wall muscle soreness. She notes a FHx of heart-disease, HTN, DM, and kidney disease. Pt denies n/v/d and diaphoresis. She admits she started using a new energy drink yesterday.  Past Medical History  Diagnosis Date  . Allergy   . GERD (gastroesophageal reflux disease)    Past Surgical History  Procedure Laterality Date  . Tubal ligation    . Abdominal hysterectomy    . Tubal ligation     Family History  Problem Relation Age of Onset  . Diabetes Mother   . Hypertension Mother   . Diabetes Father   . Hypertension Father   . Cancer Sister   . Diabetes Brother   . Hypertension Brother   . Hyperlipidemia Brother    Social History  Substance Use Topics  . Smoking status: Never Smoker   . Smokeless tobacco: None  . Alcohol Use: No   OB History    No data available     Review of Systems  All other systems reviewed and are negative.   Allergies  Codeine and Penicillins  Home Medications   Prior to Admission medications   Medication Sig Start Date  End Date Taking? Authorizing Provider  chlorpheniramine-HYDROcodone (TUSSIONEX PENNKINETIC ER) 10-8 MG/5ML SUER Take 5 mLs by mouth at bedtime as needed. 04/23/15   Collie Siad English, PA  fluconazole (DIFLUCAN) 150 MG tablet Take 1 tablet (150 mg total) by mouth once. Repeat if needed 04/23/15   Collie Siad English, PA  Guaifenesin [facility] MAXIMUM STRENGTH) 1200 MG TB12 Take 1 tablet (1,200 mg total) by mouth every 12 (twelve) hours as needed. 04/23/15   Collie Siad English, PA  levofloxacin (LEVAQUIN) 750 MG tablet Take 1 tablet (750 mg total) by mouth daily. 04/23/15   Collie Siad English, PA  oxymetazoline (AFRIN) 0.05 % nasal spray Place 1 spray into both nostrils 2 (two) times daily. DO NOT USE LONGER THAN 3 DAYS. 04/23/15   Collie Siad English, PA   BP 134/80 mmHg  Pulse 70  Temp(Src) 98.5 F (36.9 C) (Oral)  Resp 18  Ht  (1.651 m)  Wt 260 lb (117.935 kg)  BMI 43.27 kg/m2  SpO2 100% Physical Exam General: Well-developed, well-nourished female in no acute distress; appearance consistent with age of record HENT: normocephalic; atraumatic Eyes: pupils equal, round and reactive to light; extraocular muscles intact Neck: supple Heart: regular rate and rhythm; no murmurs, rubs or gallops Lungs: clear to auscultation bilaterally Chest: Left upper chest wall TTP, different from the pain described in the chief  complaint Abdomen: soft; nondistended; nontender; no masses or hepatosplenomegaly; bowel sounds present Extremities: No deformity; full range of motion; pulses normal Neurologic: Awake, alert and oriented; motor function intact in all extremities and symmetric; no facial droop Skin: Warm and dry Psychiatric: Normal mood and affect   ED Course  Procedures   MDM   Nursing notes and vitals signs, including pulse oximetry, reviewed.  Summary of this visit's results, reviewed by myself:   EKG Interpretation  Date/Time:  Monday June 04 2015 20:52:09 EDT Ventricular Rate:   69 PR Interval:  152 QRS Duration: 80 QT Interval:  406 QTC Calculation: 435 R Axis:   65 Text Interpretation:  Normal sinus rhythm Cannot rule out Anterior infarct , age undetermined Abnormal ECG Confirmed by BEATON  MD, ROBERT ([id number]) on 06/04/2015 10:21:07 PM       Labs:  Results for orders placed or performed during the hospital encounter of 06/04/15 (from the past 24 hour(s))  CBC with Differential/Platelet     Status: Abnormal   Collection Time: 06/04/15 11:20 PM  Result Value Ref Range   WBC 12.8 (H) 4.0 - 10.5 K/uL   RBC 4.35 3.87 - 5.11 MIL/uL   Hemoglobin 11.9 (L) 12.0 - 15.0 g/dL   HCT 16.135.4 (L) 09.636.0 - 04.546.0 %   MCV 81.4 78.0 - 100.0 fL   MCH 27.4 26.0 - 34.0 pg   MCHC 33.6 30.0 - 36.0 g/dL   RDW 40.913.7 81.111.5 - 91.415.5 %   Platelets 327 150 - 400 K/uL   Neutrophils Relative % 49 %   Neutro Abs 6.3 1.7 - 7.7 K/uL   Lymphocytes Relative 38 %   Lymphs Abs 4.8 (H) 0.7 - 4.0 K/uL   Monocytes Relative 11 %   Monocytes Absolute 1.5 (H) 0.1 - 1.0 K/uL   Eosinophils Relative 2 %   Eosinophils Absolute 0.3 0.0 - 0.7 K/uL   Basophils Relative 0 %   Basophils Absolute 0.0 0.0 - 0.1 K/uL  Basic metabolic panel     Status: Abnormal   Collection Time: 06/04/15 11:20 PM  Result Value Ref Range   Sodium 138 135 - 145 mmol/L   Potassium 3.3 (L) 3.5 - 5.1 mmol/L   Chloride 105 101 - 111 mmol/L   CO2 26 22 - 32 mmol/L   Glucose, Bld 102 (H) 65 - 99 mg/dL   BUN 17 6 - 20 mg/dL   Creatinine, Ser 7.820.77 0.44 - 1.00 mg/dL   Calcium 9.5 8.9 - 95.610.3 mg/dL   GFR calc non Af Amer >60 >60 mL/min   GFR calc Af Amer >60 >60 mL/min   Anion gap 7 5 - 15  Troponin I     Status: None   Collection Time: 06/04/15 11:20 PM  Result Value Ref Range   Troponin I <0.03 <0.031 ng/mL    Imaging Studies: Dg Chest 2 View  06/04/2015  CLINICAL DATA:  Left-sided chest spasms in some left lower chest pain with shortness of breath today. EXAM: CHEST  2 VIEW COMPARISON:  None. FINDINGS: The cardiomediastinal  silhouette is within normal limits. The lungs are well inflated and clear. There is no evidence of pleural effusion or pneumothorax. No acute osseous abnormality is identified. IMPRESSION: No active cardiopulmonary disease. Electronically Signed   By: Sebastian AcheAllen  Grady M.D.   On: 06/04/2015 23:50   1:06 AM Patient feels better after GI cocktail. She said consistent on leaving because she has to be at work in the morning. Her pain is atypical  for cardiac etiology. She has a follow-up appointment with the gastroenterologist scheduled.   Paula Libra, MD 06/05/15 [phone]

## 2015-06-04 NOTE — ED Notes (Signed)
Patient states that she had had a chest spasm to her left chest all day. She reports that she had it recent at 2020 and 2043

## 2015-06-04 NOTE — ED Notes (Signed)
MD at bedside. 

## 2015-06-04 NOTE — ED Notes (Signed)
Pt to desk stating she wants to leave due to wait. Pt made aware that MD was on his way to see her now. Dr. Read DriversMolpus aware. Apologized for wait and pt returned to room.

## 2015-06-05 MED ORDER — GI COCKTAIL ~~LOC~~
30.0000 mL | Freq: Once | ORAL | Status: AC
  Administered 2015-06-05: 30 mL via ORAL
  Filled 2015-06-05: qty 30

## 2015-06-05 NOTE — ED Notes (Signed)
MD at bedside. 

## 2015-06-05 NOTE — ED Notes (Signed)
Pt ambulatory to bathroom without difficulty.  

## 2015-07-18 DIAGNOSIS — K9509 Other complications of gastric band procedure: Secondary | ICD-10-CM | POA: Insufficient documentation

## 2015-07-18 DIAGNOSIS — M1710 Unilateral primary osteoarthritis, unspecified knee: Secondary | ICD-10-CM | POA: Insufficient documentation

## 2015-07-18 DIAGNOSIS — M179 Osteoarthritis of knee, unspecified: Secondary | ICD-10-CM | POA: Insufficient documentation

## 2015-07-18 DIAGNOSIS — Z9884 Bariatric surgery status: Secondary | ICD-10-CM | POA: Insufficient documentation

## 2015-07-21 DIAGNOSIS — Z803 Family history of malignant neoplasm of breast: Secondary | ICD-10-CM | POA: Insufficient documentation

## 2015-07-31 DIAGNOSIS — G43909 Migraine, unspecified, not intractable, without status migrainosus: Secondary | ICD-10-CM | POA: Insufficient documentation

## 2015-09-19 DIAGNOSIS — K2100 Gastro-esophageal reflux disease with esophagitis, without bleeding: Secondary | ICD-10-CM | POA: Insufficient documentation

## 2015-10-05 ENCOUNTER — Encounter: Payer: Self-pay | Admitting: Physician Assistant

## 2015-10-05 DIAGNOSIS — K219 Gastro-esophageal reflux disease without esophagitis: Secondary | ICD-10-CM | POA: Insufficient documentation

## 2015-11-15 DIAGNOSIS — Z9884 Bariatric surgery status: Secondary | ICD-10-CM | POA: Diagnosis not present

## 2015-11-15 DIAGNOSIS — K210 Gastro-esophageal reflux disease with esophagitis: Secondary | ICD-10-CM | POA: Diagnosis not present

## 2015-12-05 DIAGNOSIS — K912 Postsurgical malabsorption, not elsewhere classified: Secondary | ICD-10-CM | POA: Insufficient documentation

## 2015-12-05 DIAGNOSIS — K911 Postgastric surgery syndromes: Secondary | ICD-10-CM | POA: Insufficient documentation

## 2015-12-13 ENCOUNTER — Ambulatory Visit (INDEPENDENT_AMBULATORY_CARE_PROVIDER_SITE_OTHER): Payer: BLUE CROSS/BLUE SHIELD | Admitting: Physician Assistant

## 2015-12-13 ENCOUNTER — Encounter: Payer: Self-pay | Admitting: Physician Assistant

## 2015-12-13 VITALS — BP 112/70 | HR 64 | Temp 98.1°F | Resp 16 | Wt 251.0 lb

## 2015-12-13 DIAGNOSIS — Z23 Encounter for immunization: Secondary | ICD-10-CM

## 2015-12-13 NOTE — Progress Notes (Signed)
[facility] [address], [place] [address] [phone]  By signing my name below, I, Jackie Ellis, attest that this documentation has been prepared under the direction and in the presence of [place]phanie English, PA-C. Electronically Signed: Arvilla MarketMesha Ellis, Medical Scribe. 12/13/15. 1:59 PM.  Date:  12/13/2015   Name:  Jackie Ellis   DOB:  1965-12-06   MRN:  [id number]  PCP:  No primary care provider on file.   Chief Complaint  Patient presents with   Flu Vaccine    form for work    History of Present Illness:  Jackie Ellis is a 50 y.o. female patient who [facility]. Pt would like to get her Tdap, and her flu vaccine for her job. 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  Pt has lost 32 lbs since, then but she doesn't have any energy. Pt mentions she never got any of her Hep vaccines. Pt's last Tdap vaccine was last done 11/05/1986 on file through "Care Everywhere".   There is no immunization history on file for this patient.  Patient Active Problem List   Diagnosis Date Noted   Acid reflux 10/05/2015    Past Medical History:  Diagnosis Date   Allergy    GERD (gastroesophageal reflux disease)     Past Surgical History:  Procedure Laterality Date   ABDOMINAL HYSTERECTOMY     GASTRECTOMY     TUBAL LIGATION     TUBAL LIGATION      Social History  Substance Use Topics   Smoking status: Never Smoker   Smokeless tobacco: Never Used   Alcohol use No    Family History  Problem Relation Age of Onset   Diabetes Mother    Hypertension Mother    Diabetes Father    Hypertension Father    Cancer Sister    Diabetes Brother    Hypertension Brother    Hyperlipidemia Brother     Allergies  Allergen Reactions   Shellfish Allergy Anaphylaxis   Sulfa Antibiotics Other (See Comments)    blisters    Codeine Rash   Penicillins Rash    Medication list has been reviewed and updated.  Current Outpatient Prescriptions on File Prior to Visit  Medication Sig Dispense Refill   chlorpheniramine-HYDROcodone (TUSSIONEX PENNKINETIC ER) 10-8 MG/5ML SUER Take 5 mLs by mouth at bedtime as needed. (Patient not taking: Reported on 12/13/2015) 80 mL 0   fluconazole (DIFLUCAN) 150 MG tablet Take 1 tablet (150 mg total) by mouth once. Repeat if needed (Patient not taking: Reported on 12/13/2015) 2 tablet 0   Guaifenesin (MUCINEX MAXIMUM STRENGTH) 1200 MG TB12 Take 1 tablet (1,200 mg total) by mouth every 12 (twelve) hours as needed. (Patient not taking: Reported on 12/13/2015) 14 tablet 1   levofloxacin (LEVAQUIN) 750 MG tablet Take 1 tablet (750 mg total) by mouth daily. (Patient not taking: Reported on 12/13/2015) 5 tablet 0   oxymetazoline (AFRIN) 0.05 % nasal spray Place 1 spray into both nostrils 2 (two) times daily. DO NOT USE LONGER THAN 3 DAYS. (Patient not taking: Reported on 12/13/2015) 30 mL 0   No current facility-administered medications on file prior to visit.     Review of Systems  Constitutional: Positive for malaise/fatigue. Negative for weight loss.   Physical Examination: BP 112/70 (BP Location: Right Arm, Cuff Size: Large)    Pulse 64    Temp  98.1 F (36.7 C) (Oral)    Resp 16    Wt 251 lb (113.9 kg)    BMI 41.77 kg/m  Ideal Body Weight: @FLOWAMB ([id number])@  Physical Exam  Constitutional: She is oriented to person, place, and time. She appears well-developed and well-nourished. No distress.  HENT:  Head: Normocephalic and atraumatic.  Right Ear: External ear normal.  Left Ear: External ear normal.  Eyes: Conjunctivae and EOM are normal. Pupils are equal, round, and reactive to light.  Cardiovascular: Normal rate.   Pulmonary/Chest: Effort normal. No respiratory distress.  Neurological: She is alert and oriented to person, place, and time.  Skin: She is not diaphoretic.    Psychiatric: She has a normal mood and affect. Her behavior is normal.   Assessment and Plan: [name] is a 50 y.o. female who is here today for tdap and flu vaccine. T8 And flu vaccine were given today. Return to clinic as needed. Need for Tdap vaccination - Plan: Tdap vaccine greater than or equal to 7yo IM  Need for influenza vaccination - Plan: Flu Vaccine QUAD 36+ mos IM  Trena Platt, PA-C [facility] 9/30/20179:43 PM  I personally performed the services described in this documentation, which was scribed in my presence. The recorded information has been reviewed and is accurate.

## 2016-01-17 ENCOUNTER — Encounter: Payer: Self-pay | Admitting: Physician Assistant

## 2016-03-05 ENCOUNTER — Other Ambulatory Visit: Payer: Self-pay | Admitting: Dermatology

## 2016-03-05 DIAGNOSIS — D229 Melanocytic nevi, unspecified: Secondary | ICD-10-CM | POA: Diagnosis not present

## 2016-03-05 DIAGNOSIS — L821 Other seborrheic keratosis: Secondary | ICD-10-CM | POA: Diagnosis not present

## 2016-03-05 DIAGNOSIS — R109 Unspecified abdominal pain: Secondary | ICD-10-CM | POA: Diagnosis not present

## 2016-03-05 DIAGNOSIS — K912 Postsurgical malabsorption, not elsewhere classified: Secondary | ICD-10-CM | POA: Diagnosis not present

## 2016-03-05 DIAGNOSIS — R1084 Generalized abdominal pain: Secondary | ICD-10-CM | POA: Insufficient documentation

## 2016-03-05 DIAGNOSIS — D492 Neoplasm of unspecified behavior of bone, soft tissue, and skin: Secondary | ICD-10-CM | POA: Diagnosis not present

## 2016-03-11 DIAGNOSIS — K912 Postsurgical malabsorption, not elsewhere classified: Secondary | ICD-10-CM | POA: Diagnosis not present

## 2016-03-19 DIAGNOSIS — K912 Postsurgical malabsorption, not elsewhere classified: Secondary | ICD-10-CM | POA: Diagnosis not present

## 2016-03-19 DIAGNOSIS — Z9884 Bariatric surgery status: Secondary | ICD-10-CM | POA: Diagnosis not present

## 2016-03-19 DIAGNOSIS — Z713 Dietary counseling and surveillance: Secondary | ICD-10-CM | POA: Diagnosis not present

## 2016-03-25 ENCOUNTER — Encounter: Payer: Self-pay | Admitting: Physician Assistant

## 2016-03-25 ENCOUNTER — Ambulatory Visit (INDEPENDENT_AMBULATORY_CARE_PROVIDER_SITE_OTHER): Payer: BLUE CROSS/BLUE SHIELD | Admitting: Physician Assistant

## 2016-03-25 VITALS — BP 100/70 | HR 79 | Temp 99.5°F | Resp 16 | Ht 65.0 in | Wt 200.0 lb

## 2016-03-25 DIAGNOSIS — G44001 Cluster headache syndrome, unspecified, intractable: Secondary | ICD-10-CM

## 2016-03-25 DIAGNOSIS — R51 Headache: Secondary | ICD-10-CM

## 2016-03-25 DIAGNOSIS — R519 Headache, unspecified: Secondary | ICD-10-CM

## 2016-03-25 LAB — POCT URINALYSIS DIP (MANUAL ENTRY)
GLUCOSE UA: NEGATIVE
Leukocytes, UA: NEGATIVE
NITRITE UA: NEGATIVE
PH UA: 6
Protein Ur, POC: 30 — AB
Spec Grav, UA: 1.025
UROBILINOGEN UA: 0.2

## 2016-03-25 MED ORDER — KETOROLAC TROMETHAMINE 30 MG/ML IJ SOLN
30.0000 mg | Freq: Once | INTRAMUSCULAR | Status: AC
  Administered 2016-03-25: 30 mg via INTRAMUSCULAR

## 2016-03-25 MED ORDER — TIZANIDINE HCL 2 MG PO TABS: 2.0000 mg | ORAL_TABLET | Freq: Four times a day (QID) | ORAL | 1 refills | Status: DC | PRN

## 2016-03-25 MED ORDER — TIZANIDINE HCL 2 MG PO CAPS: 2.0000 mg | ORAL_CAPSULE | Freq: Four times a day (QID) | ORAL | 1 refills | Status: DC | PRN

## 2016-03-25 MED ORDER — ERGOTAMINE TARTRATE 2 MG SL SUBL: SUBLINGUAL_TABLET | SUBLINGUAL | 0 refills | Status: DC

## 2016-03-25 MED ORDER — RIZATRIPTAN BENZOATE 5 MG PO TBDP: 5.0000 mg | ORAL_TABLET | ORAL | 1 refills | Status: DC | PRN

## 2016-03-25 NOTE — Patient Instructions (Addendum)
     IF you received an x-ray today, you will receive an invoice from [facility]. Please contact [facility] at [phone] with questions or concerns regarding your invoice.   IF you received labwork today, you will receive an invoice from [facility]. Please contact [facility] at [phone] with questions or concerns regarding your invoice.   Our billing staff will not be able to assist you with questions regarding bills from these companies.  You will be contacted with the lab results as soon as they are available. The fastest way to get your results is to activate your My Chart account. Instructions are located on the last page of this paperwork. If you have not heard from us regarding the results in 2 weeks, please contact this office.    Please take medication as prescribed.  I would like you to follow up in 48 -72 hours if your symptoms do not improve.  You can take the ergotamine with caffeine.   I need you to get in your hydration.  Make sure you get on a regimen of drinking every 15 minutes, to get your 64oz in. Be careful with operating heavy machinery with the zanaflex.   I will follow up with you with the lab results.   Cluster Headache Cluster headaches are deeply painful. They normally occur on one side of your head, but they may switch sides. Often, cluster headaches:  Are severe.  Happen often for a few weeks or months and then go away for a while.  Last from 15 minutes to 3 hours.  Happen at the same time each day.  Happen at night.  Happen many times a day. Follow these instructions at home: Follow instructions from your doctor to care for yourself at home:  Go to bed at the same time each night. Get the same amount of sleep every night.  Avoid alcohol.  Stop smoking if you smoke. This includes cigarettes and e-cigarettes.  Take over-the-counter and prescription medicines only as told by your doctor.  Do not drive or use heavy machinery  while taking prescription pain medicine.  Use oxygen as told by your doctor.  Exercise regularly.  Eat a healthy diet.  Write down when each headache happened, what kind of pain you had, how bad your pain was, and what you tried to help your pain. This is called a headache diary. Use it as told by your doctor. Contact a doctor if:  Your headaches get worse or they happen more often.  Your medicines are not helping. Get help right away if:  You pass out (faint).  You get weak or lose feeling (have numbness) on one side of your body or face.  You see two of everything (double vision).  You feel sick to your stomach (nauseous) or you throw up (vomit), and you do not stop after many hours.  You have trouble with your balance or with walking.  You have trouble talking.  You have neck pain or stiffness.  You have a fever. This information is not intended to replace advice given to you by your health care provider. Make sure you discuss any questions you have with your health care provider. Document Released: 04/10/2004 Document Revised: 11/09/2015 Document Reviewed: 11/09/2015 Elsevier Interactive Patient Education  2017 [employer]

## 2016-03-25 NOTE — Progress Notes (Signed)
[facility] [address], [place] [address] [phone]  Date:  03/25/2016   Name:  Jackie Ellis   DOB:  1965/12/08   MRN:  [id number]  PCP:  No primary care provider on file.    History of Present Illness:  Jackie Ellis is a 51 y.o. female patient with pmh of allergies, s/p gastrectomy, and chronic migraines who presents to [facility] for cc of headache. --dxd with cluster headaches.   -Starts at the back of head to the front.  Eyes hurt, photophobia.  Nausea.  Woke up with dizziness.  Last week, 72 hours long.  She assumed it was sinus related, took sudafed and nasal rinse, which seemed to worsen symptoms.  Tylenol and heat which eased, but did not resolve.  Yesterday, symptoms began.  These are consistent with the start of her chronic migraines..  She has white floaters auras which happen pre-emptively.  8oz of food daily 32oz of fluids. 11/14/2015 gastrectomy.  60 lb intentional weight loss sp gastrectomy.  She was place on imitrex injections in the past, but stopped due to side effect of sedation with use.  --migraine hx, she has used excedrin in the past.      Patient Active Problem List   Diagnosis Date Noted  . Acid reflux 10/05/2015    Past Medical History:  Diagnosis Date  . Allergy   . GERD (gastroesophageal reflux disease)     Past Surgical History:  Procedure Laterality Date  . ABDOMINAL HYSTERECTOMY    . GASTRECTOMY    . TUBAL LIGATION    . TUBAL LIGATION      Social History  Substance Use Topics  . Smoking status: Never Smoker  . Smokeless tobacco: Never Used  . Alcohol use No    Family History  Problem Relation Age of Onset  . Diabetes Mother   . Hypertension Mother   . Diabetes Father   . Hypertension Father   . Cancer Sister   . Diabetes Brother   . Hypertension Brother   . Hyperlipidemia Brother     Allergies  Allergen Reactions  . Shellfish Allergy Anaphylaxis  . Sulfa Antibiotics Other (See Comments)     blisters  . Codeine Rash  . Penicillins Rash    Medication list has been reviewed and updated.  Current Outpatient Prescriptions on File Prior to Visit  Medication Sig Dispense Refill  . Cyanocobalamin 200 MCG/SPRAY LIQD Take by mouth.    . ferrous sulfate 325 (65 FE) MG tablet Take 325 mg by mouth daily with breakfast.    . magnesium citrate solution Take 296 mLs by mouth 3 (three) times daily.    . pantoprazole (PROTONIX) 40 MG tablet Take 40 mg by mouth daily.    . polyethylene glycol (MIRALAX / GLYCOLAX) packet Take 17 g by mouth daily.    . fluconazole (DIFLUCAN) 150 MG tablet Take 1 tablet (150 mg total) by mouth once. Repeat if needed (Patient not taking: Reported on 03/25/2016) 2 tablet 0  . Guaifenesin (MUCINEX MAXIMUM STRENGTH) 1200 MG TB12 Take 1 tablet (1,200 mg total) by mouth every 12 (twelve) hours as needed. (Patient not taking: Reported on 03/25/2016) 14 tablet 1  . oxymetazoline (AFRIN) 0.05 % nasal spray Place 1 spray into both nostrils 2 (two) times daily. DO NOT USE LONGER THAN 3 DAYS. (Patient not taking: Reported on 03/25/2016) 30 mL 0   No current facility-administered medications on file prior to visit.     ROS ROS  otherwise unremarkable unless listed above.   Physical Examination: BP 100/70   Pulse 79   Temp 99.5 F (37.5 C) (Oral)   Resp 16   Ht '5\' 5"'  (1.651 m)   Wt 200 lb (90.7 kg)   SpO2 97%   BMI 33.28 kg/m  Ideal Body Weight: Weight in (lb) to have BMI = 25: 149.9  Physical Exam  Constitutional: She is oriented to person, place, and time. She appears well-developed and well-nourished. No distress.  HENT:  Head: Normocephalic and atraumatic.  Right Ear: External ear normal.  Left Ear: External ear normal.  Eyes: Conjunctivae and EOM are normal. Pupils are equal, round, and reactive to light.  Cardiovascular: Normal rate and regular rhythm.  Exam reveals no friction rub.   No murmur heard. Pulmonary/Chest: Effort normal. No respiratory  distress. She has no wheezes.  Musculoskeletal:  Left trapezius with muscle tensity consistent with spasm.  Neurological: She is alert and oriented to person, place, and time. No cranial nerve deficit. Coordination normal.  Normal f to n.  Normal ocular movement.  Skin: She is not diaphoretic.  Psychiatric: She has a normal mood and affect. Her behavior is normal.     Assessment and Plan: Jackie Ellis is a 51 y.o. female who is here today for cc of migraine headache. Vitals wnl today, and symptoms are consistent with her previous migraine symptoms.  Given toradol today.  Also advised zanaflex.  Initially given ergotamine, but this was too expensive.  Given maxalt sublingual.  Follow up as needed.  Intractable headache, unspecified chronicity pattern, unspecified headache type - Plan: CBC with Differential/Platelet, CMP14+EGFR, POCT urinalysis dipstick, Vitamin B12, ketorolac (TORADOL) 30 MG/ML injection 30 mg, tizanidine (ZANAFLEX) 2 MG capsule  Intractable cluster headache syndrome, unspecified chronicity pattern  Ivar Drape, PA-C [facility] 1/9/201812:13 PM

## 2016-03-26 LAB — CBC WITH DIFFERENTIAL/PLATELET
Basophils Absolute: 0 10*3/uL (ref 0.0–0.2)
Basos: 0 %
EOS (ABSOLUTE): 0.1 10*3/uL (ref 0.0–0.4)
Eos: 1 %
Hematocrit: 37.5 % (ref 34.0–46.6)
Hemoglobin: 12.6 g/dL (ref 11.1–15.9)
IMMATURE GRANS (ABS): 0 10*3/uL (ref 0.0–0.1)
IMMATURE GRANULOCYTES: 0 %
LYMPHS: 28 %
Lymphocytes Absolute: 2.1 10*3/uL (ref 0.7–3.1)
MCH: 27.9 pg (ref 26.6–33.0)
MCHC: 33.6 g/dL (ref 31.5–35.7)
MCV: 83 fL (ref 79–97)
MONOS ABS: 0.9 10*3/uL (ref 0.1–0.9)
Monocytes: 12 %
NEUTROS PCT: 59 %
Neutrophils Absolute: 4.4 10*3/uL (ref 1.4–7.0)
Platelets: 275 10*3/uL (ref 150–379)
RBC: 4.51 x10E6/uL (ref 3.77–5.28)
RDW: 15.6 % — AB (ref 12.3–15.4)
WBC: 7.6 10*3/uL (ref 3.4–10.8)

## 2016-03-26 LAB — CMP14+EGFR
A/G RATIO: 1.3 (ref 1.2–2.2)
ALT: 22 IU/L (ref 0–32)
AST: 17 IU/L (ref 0–40)
Albumin: 4.1 g/dL (ref 3.5–5.5)
Alkaline Phosphatase: 78 IU/L (ref 39–117)
BUN/Creatinine Ratio: 18 (ref 9–23)
BUN: 13 mg/dL (ref 6–24)
Bilirubin Total: 0.4 mg/dL (ref 0.0–1.2)
CALCIUM: 9.4 mg/dL (ref 8.7–10.2)
CHLORIDE: 102 mmol/L (ref 96–106)
CO2: 23 mmol/L (ref 18–29)
Creatinine, Ser: 0.73 mg/dL (ref 0.57–1.00)
GFR calc Af Amer: 111 mL/min/{1.73_m2} (ref >59)
GFR, EST NON AFRICAN AMERICAN: 96 mL/min/{1.73_m2} (ref >59)
GLUCOSE: 84 mg/dL (ref 65–99)
Globulin, Total: 3.2 g/dL (ref 1.5–4.5)
POTASSIUM: 4 mmol/L (ref 3.5–5.2)
Sodium: 141 mmol/L (ref 134–144)
TOTAL PROTEIN: 7.3 g/dL (ref 6.0–8.5)

## 2016-03-26 LAB — VITAMIN B12: Vitamin B-12: 576 pg/mL (ref 232–1245)

## 2016-03-31 ENCOUNTER — Encounter: Payer: Self-pay | Admitting: Physician Assistant

## 2016-04-21 ENCOUNTER — Encounter: Payer: Self-pay | Admitting: Physician Assistant

## 2016-05-07 ENCOUNTER — Encounter: Payer: Self-pay | Admitting: Physician Assistant

## 2016-06-14 DIAGNOSIS — H5203 Hypermetropia, bilateral: Secondary | ICD-10-CM | POA: Diagnosis not present

## 2016-06-20 ENCOUNTER — Other Ambulatory Visit: Payer: Self-pay | Admitting: Physician Assistant

## 2016-06-20 NOTE — Telephone Encounter (Signed)
03/25/16 last ov and refill 

## 2016-06-26 ENCOUNTER — Other Ambulatory Visit: Payer: Self-pay | Admitting: Physician Assistant

## 2016-06-27 ENCOUNTER — Other Ambulatory Visit: Payer: Self-pay | Admitting: Physician Assistant

## 2016-06-27 NOTE — Telephone Encounter (Signed)
03/2016 last refill 

## 2016-06-29 NOTE — Telephone Encounter (Signed)
03/25/16 last refll

## 2016-07-01 DIAGNOSIS — K828 Other specified diseases of gallbladder: Secondary | ICD-10-CM | POA: Diagnosis not present

## 2016-07-01 DIAGNOSIS — K5900 Constipation, unspecified: Secondary | ICD-10-CM | POA: Diagnosis not present

## 2016-07-01 DIAGNOSIS — R110 Nausea: Secondary | ICD-10-CM | POA: Diagnosis not present

## 2016-07-01 DIAGNOSIS — N83201 Unspecified ovarian cyst, right side: Secondary | ICD-10-CM | POA: Diagnosis not present

## 2016-07-01 DIAGNOSIS — R1013 Epigastric pain: Secondary | ICD-10-CM | POA: Diagnosis not present

## 2016-07-23 ENCOUNTER — Telehealth: Payer: Self-pay | Admitting: Family Medicine

## 2016-07-23 ENCOUNTER — Encounter: Payer: Self-pay | Admitting: Physician Assistant

## 2016-07-23 ENCOUNTER — Ambulatory Visit (INDEPENDENT_AMBULATORY_CARE_PROVIDER_SITE_OTHER): Payer: BLUE CROSS/BLUE SHIELD | Admitting: Physician Assistant

## 2016-07-23 VITALS — BP 124/81 | HR 67 | Temp 98.8°F | Resp 16 | Ht 65.25 in | Wt 174.2 lb

## 2016-07-23 DIAGNOSIS — R1030 Lower abdominal pain, unspecified: Secondary | ICD-10-CM | POA: Diagnosis not present

## 2016-07-23 DIAGNOSIS — M25551 Pain in right hip: Secondary | ICD-10-CM | POA: Diagnosis not present

## 2016-07-23 DIAGNOSIS — M545 Low back pain: Secondary | ICD-10-CM | POA: Diagnosis not present

## 2016-07-23 DIAGNOSIS — M542 Cervicalgia: Secondary | ICD-10-CM | POA: Diagnosis not present

## 2016-07-23 DIAGNOSIS — Z01419 Encounter for gynecological examination (general) (routine) without abnormal findings: Secondary | ICD-10-CM | POA: Diagnosis not present

## 2016-07-23 DIAGNOSIS — M47812 Spondylosis without myelopathy or radiculopathy, cervical region: Secondary | ICD-10-CM | POA: Diagnosis not present

## 2016-07-23 DIAGNOSIS — Z1211 Encounter for screening for malignant neoplasm of colon: Secondary | ICD-10-CM | POA: Diagnosis not present

## 2016-07-23 DIAGNOSIS — R519 Headache, unspecified: Secondary | ICD-10-CM

## 2016-07-23 DIAGNOSIS — M25552 Pain in left hip: Secondary | ICD-10-CM | POA: Diagnosis not present

## 2016-07-23 DIAGNOSIS — M549 Dorsalgia, unspecified: Secondary | ICD-10-CM | POA: Diagnosis not present

## 2016-07-23 DIAGNOSIS — R232 Flushing: Secondary | ICD-10-CM | POA: Diagnosis not present

## 2016-07-23 DIAGNOSIS — R51 Headache: Secondary | ICD-10-CM | POA: Diagnosis not present

## 2016-07-23 DIAGNOSIS — J301 Allergic rhinitis due to pollen: Secondary | ICD-10-CM

## 2016-07-23 DIAGNOSIS — Z1231 Encounter for screening mammogram for malignant neoplasm of breast: Secondary | ICD-10-CM | POA: Diagnosis not present

## 2016-07-23 MED ORDER — RIZATRIPTAN BENZOATE 5 MG PO TBDP: ORAL_TABLET | ORAL | 5 refills | Status: DC

## 2016-07-23 MED ORDER — CETIRIZINE HCL 10 MG PO TABS: 10.0000 mg | ORAL_TABLET | Freq: Every day | ORAL | 5 refills | Status: DC

## 2016-07-23 MED ORDER — FLUTICASONE PROPIONATE 50 MCG/ACT NA SUSP: 2.0000 | Freq: Every day | NASAL | 5 refills | Status: DC

## 2016-07-23 MED ORDER — ONDANSETRON 8 MG PO TBDP: 8.0000 mg | ORAL_TABLET | Freq: Three times a day (TID) | ORAL | 0 refills | Status: DC | PRN

## 2016-07-23 MED ORDER — DICLOFENAC SODIUM 1 % TD GEL: 4.0000 g | Freq: Four times a day (QID) | TRANSDERMAL | 0 refills | Status: DC

## 2016-07-23 MED ORDER — TIZANIDINE HCL 2 MG PO CAPS: 2.0000 mg | ORAL_CAPSULE | Freq: Four times a day (QID) | ORAL | 2 refills | Status: DC | PRN

## 2016-07-23 NOTE — Telephone Encounter (Signed)
PT CALLING STATING THAT HER PHARMACIST TOLD HER THAT HER RX FOR VOLTAREN GEL NEED PRIOR AUTHORIZATION PATIENT STATES THAT IF IT DOESN'T GO THROUGH [facility] TRY ANOTHER MEDICINE PLEASE RESPOND

## 2016-07-23 NOTE — Progress Notes (Signed)
PRIMARY [facility] AT [place], [place] [address] [phone]  Date:  07/23/2016   Name:  Jackie Ellis   DOB:  1965-03-22   MRN:  [id number]  PCP:  Joretta Bachelor, PA    History of Present Illness:  Jackie Ellis is a 51 y.o. female patient who presents to PCP with  Chief Complaint  Patient presents with  . Allergies    with nausea  . Follow-up    headache  . Bone scan  . Medication Refill    maxalt     Patient reports increased congestion, and sneezing.  She reports post nasal drainage.  She also has associated nausea over the last month.  She has noticed that in the last 1.5 months, her headaches have increased.  These feature similar migraine symptoms that she generally has.  No fever.  Some coughing.  Ear discomfort. She has run out of the maxalt which has been difficult with the influx of migraines.  She states that the zanaflex helps as well with her migraines.   She also reports increased hip pain.  This moreso of both hips.  No instability.  More pain when she is laying down.  No pain in other joints.  Feels like it makes sounds.  No prior injury.     Patient Active Problem List   Diagnosis Date Noted  . Acid reflux 10/05/2015    Past Medical History:  Diagnosis Date  . Allergy   . GERD (gastroesophageal reflux disease)     Past Surgical History:  Procedure Laterality Date  . ABDOMINAL HYSTERECTOMY    . GASTRECTOMY    . TUBAL LIGATION    . TUBAL LIGATION      Social History  Substance Use Topics  . Smoking status: Never Smoker  . Smokeless tobacco: Never Used  . Alcohol use No    Family History  Problem Relation Age of Onset  . Diabetes Mother   . Hypertension Mother   . Diabetes Father   . Hypertension Father   . Cancer Sister   . Diabetes Brother   . Hypertension Brother   . Hyperlipidemia Brother     Allergies  Allergen Reactions  . Shellfish Allergy Anaphylaxis  . Aspirin   . Sulfa Antibiotics Other  (See Comments)    blisters  . Codeine Rash  . Penicillins Rash    Medication list has been reviewed and updated.  Current Outpatient Prescriptions on File Prior to Visit  Medication Sig Dispense Refill  . pantoprazole (PROTONIX) 40 MG tablet Take 40 mg by mouth daily.    . polyethylene glycol (MIRALAX / GLYCOLAX) packet Take 17 g by mouth daily.    . rizatriptan (MAXALT-MLT) 5 MG disintegrating tablet DISSOLVE ONE TABLET BY MOUTH BY MOUTH AS NEEDED FOR MIGRAINE. MAY REPEAT IN 2 HOURS AS NEEDED. MAXIMUM OF 6 TABLETS IN 24 HOURS 10 tablet 2  . tizanidine (ZANAFLEX) 2 MG capsule Take 1 capsule (2 mg total) by mouth every 6 (six) hours as needed for muscle spasms. 30 capsule 1   No current facility-administered medications on file prior to visit.     ROS ROS otherwise unremarkable unless listed above.  Physical Examination: BP 124/81   Pulse 67   Temp 98.8 F (37.1 C) (Oral)   Resp 16   Ht 5' 5.25" (1.657 m)   Wt 174 lb 3.2 oz (79 kg)   SpO2 100%   BMI 28.77 kg/m  Ideal Body Weight: Weight in (lb) to  have BMI = 25: 151.1  Physical Exam  Constitutional: She is oriented to person, place, and time. She appears well-developed and well-nourished. No distress.  HENT:  Head: Normocephalic and atraumatic.  Right Ear: Tympanic membrane, external ear and ear canal normal.  Left Ear: Tympanic membrane, external ear and ear canal normal.  Nose: Mucosal edema and rhinorrhea present. Right sinus exhibits no maxillary sinus tenderness and no frontal sinus tenderness. Left sinus exhibits no maxillary sinus tenderness and no frontal sinus tenderness.  Mouth/Throat: No uvula swelling. No oropharyngeal exudate, posterior oropharyngeal edema or posterior oropharyngeal erythema.  Eyes: Conjunctivae and EOM are normal. Pupils are equal, round, and reactive to light.  Cardiovascular: Normal rate and regular rhythm.  Exam reveals no gallop, no distant heart sounds and no friction rub.   No murmur  heard. Pulmonary/Chest: Effort normal. No respiratory distress. She has no decreased breath sounds. She has no wheezes. She has no rhonchi.  Musculoskeletal:       Left hip: Normal. She exhibits no tenderness, no bony tenderness and no crepitus.  No hip tenderness or abnormality.  rom normal.  Normal gait.    Lymphadenopathy:       Head (right side): No submandibular, no tonsillar, no preauricular and no posterior auricular adenopathy present.       Head (left side): No submandibular, no tonsillar, no preauricular and no posterior auricular adenopathy present.  Neurological: She is alert and oriented to person, place, and time.  Skin: She is not diaphoretic.  Psychiatric: She has a normal mood and affect. Her behavior is normal.     Assessment and Plan: Jackie Ellis is a 51 y.o. female who is here today for cc of hip pain.  Will check vitamins, post her bariatric surgery which may cause deficiency and joint pain.   --refilling maxalt --advised to 2nd gen anti-histamine, and steroid intranasal spray.  Intractable headache, unspecified chronicity pattern, unspecified headache type - Plan: rizatriptan (MAXALT-MLT) 5 MG disintegrating tablet, tizanidine (ZANAFLEX) 2 MG capsule, ondansetron (ZOFRAN-ODT) 8 MG disintegrating tablet, Vitamin B12  Pain of both hip joints - Plan: diclofenac sodium (VOLTAREN) 1 % GEL  Lower abdominal pain - Plan: Vitamin D 1,25 dihydroxy, CMP14+EGFR, CANCELED: CBC  Seasonal allergic rhinitis due to pollen - Plan: fluticasone (FLONASE) 50 MCG/ACT nasal spray, cetirizine (ZYRTEC) 10 MG tablet  Ivar Drape, PA-C [facility] 5/11/20187:53 AM

## 2016-07-23 NOTE — Patient Instructions (Addendum)
Hydrate well with 64 oz of water if not more.   Use the zofran for the nausea, and make sure you are doing your allergy medications daily. I will follow up with your lab results within 2 weeks.  Allergic Rhinitis Allergic rhinitis is when the mucous membranes in the nose respond to allergens. Allergens are particles in the air that cause your body to have an allergic reaction. This causes you to release allergic antibodies. Through a chain of events, these eventually cause you to release histamine into the blood stream. Although meant to protect the body, it is this release of histamine that causes your discomfort, such as frequent sneezing, congestion, and an itchy, runny nose. What are the causes? Seasonal allergic rhinitis (hay fever) is caused by pollen allergens that may come from grasses, trees, and weeds. Year-round allergic rhinitis (perennial allergic rhinitis) is caused by allergens such as house dust mites, pet dander, and mold spores. What are the signs or symptoms?  Nasal stuffiness (congestion).  Itchy, runny nose with sneezing and tearing of the eyes. How is this diagnosed? Your health care provider can help you determine the allergen or allergens that trigger your symptoms. If you and your health care provider are unable to determine the allergen, skin or blood testing may be used. Your health care provider will diagnose your condition after taking your health history and performing a physical exam. Your health care provider may assess you for other related conditions, such as asthma, pink eye, or an ear infection. How is this treated? Allergic rhinitis does not have a cure, but it can be controlled by:  Medicines that block allergy symptoms. These may include allergy shots, nasal sprays, and oral antihistamines.  Avoiding the allergen. Hay fever may often be treated with antihistamines in pill or nasal spray forms. Antihistamines block the effects of histamine. There are  over-the-counter medicines that may help with nasal congestion and swelling around the eyes. Check with your health care provider before taking or giving this medicine. If avoiding the allergen or the medicine prescribed do not work, there are many new medicines your health care provider can prescribe. Stronger medicine may be used if initial measures are ineffective. Desensitizing injections can be used if medicine and avoidance does not work. Desensitization is when a patient is given ongoing shots until the body becomes less sensitive to the allergen. Make sure you follow up with your health care provider if problems continue. Follow these instructions at home: It is not possible to completely avoid allergens, but you can reduce your symptoms by taking steps to limit your exposure to them. It helps to know exactly what you are allergic to so that you can avoid your specific triggers. Contact a health care provider if:  You have a fever.  You develop a cough that does not stop easily (persistent).  You have shortness of breath.  You start wheezing.  Symptoms interfere with normal daily activities. This information is not intended to replace advice given to you by your health care provider. Make sure you discuss any questions you have with your health care provider. Document Released: 11/26/2000 Document Revised: 11/02/2015 Document Reviewed: 11/08/2012 Elsevier Interactive Patient Education  2017 [employer].     IF you received an x-ray today, you will receive an invoice from [facility]. Please contact [facility] at [phone] with questions or concerns regarding your invoice.   IF you received labwork today, you will receive an invoice from [employer]. Please contact [facility] at  [phone] with questions or concerns regarding your invoice.   Our billing staff will not be able to assist you with questions regarding bills from these companies.  You will be  contacted with the lab results as soon as they are available. The fastest way to get your results is to activate your My Chart account. Instructions are located on the last page of this paperwork. If you have not heard from us regarding the results in 2 weeks, please contact this office.

## 2016-07-24 NOTE — Telephone Encounter (Signed)
Filed a claim with cover my meds today.  It was approved. Approval dates are 07/24/16-07/24/17.  Patient was notified and very happy with the outcome.

## 2016-07-28 LAB — VITAMIN B12: Vitamin B-12: 384 pg/mL (ref 232–1245)

## 2016-07-28 LAB — VITAMIN D 1,25 DIHYDROXY
VITAMIN D3 1, 25 (OH): 53 pg/mL
Vitamin D 1, 25 (OH)2 Total: 54 pg/mL
Vitamin D2 1, 25 (OH)2: <10 pg/mL

## 2016-07-28 LAB — CMP14+EGFR
A/G RATIO: 1.3 (ref 1.2–2.2)
ALBUMIN: 4.1 g/dL (ref 3.5–5.5)
ALT: 40 IU/L — ABNORMAL HIGH (ref 0–32)
AST: 34 IU/L (ref 0–40)
Alkaline Phosphatase: 80 IU/L (ref 39–117)
BILIRUBIN TOTAL: 0.4 mg/dL (ref 0.0–1.2)
BUN / CREAT RATIO: 18 (ref 9–23)
BUN: 12 mg/dL (ref 6–24)
CHLORIDE: 102 mmol/L (ref 96–106)
CO2: 21 mmol/L (ref 18–29)
Calcium: 9.3 mg/dL (ref 8.7–10.2)
Creatinine, Ser: 0.68 mg/dL (ref 0.57–1.00)
GFR calc non Af Amer: 102 mL/min/{1.73_m2} (ref >59)
GFR, EST AFRICAN AMERICAN: 118 mL/min/{1.73_m2} (ref >59)
Globulin, Total: 3.2 g/dL (ref 1.5–4.5)
Glucose: 81 mg/dL (ref 65–99)
POTASSIUM: 3.9 mmol/L (ref 3.5–5.2)
Sodium: 140 mmol/L (ref 134–144)
TOTAL PROTEIN: 7.3 g/dL (ref 6.0–8.5)

## 2016-07-31 ENCOUNTER — Telehealth: Payer: Self-pay

## 2016-07-31 DIAGNOSIS — Z202 Contact with and (suspected) exposure to infections with a predominantly sexual mode of transmission: Secondary | ICD-10-CM | POA: Diagnosis not present

## 2016-07-31 NOTE — Telephone Encounter (Signed)
After receiving a denial letter today, I filed a request on cover my meds.  It said the medication was approved and no PA was needed. (diclofenac gel)

## 2016-08-08 ENCOUNTER — Encounter: Payer: Self-pay | Admitting: Podiatry

## 2016-08-08 ENCOUNTER — Ambulatory Visit (INDEPENDENT_AMBULATORY_CARE_PROVIDER_SITE_OTHER): Payer: BLUE CROSS/BLUE SHIELD | Admitting: Podiatry

## 2016-08-08 ENCOUNTER — Ambulatory Visit (INDEPENDENT_AMBULATORY_CARE_PROVIDER_SITE_OTHER): Payer: BLUE CROSS/BLUE SHIELD

## 2016-08-08 DIAGNOSIS — M205X2 Other deformities of toe(s) (acquired), left foot: Secondary | ICD-10-CM | POA: Diagnosis not present

## 2016-08-08 DIAGNOSIS — M779 Enthesopathy, unspecified: Secondary | ICD-10-CM | POA: Diagnosis not present

## 2016-08-08 NOTE — Progress Notes (Signed)
   Subjective:    Patient ID: Jackie Ellis, female    DOB: 1965/11/06, 51 y.o.   MRN: [id number]  HPI 51 year old female presents the office today for concerns of pain to the left big toe joint which is been ongoing for 8 years. She states that she didn't injection 2010 and did well. She denies any recent injury or trauma. She states that she has problems arise wear high-heeled shoes. She denies any recent injury or trauma. She gets some occasional sharp pain along the area. She gets some minimal swelling but no redness or warmth. She does walk several miles a day which does aggravate her symptoms as well. She has no other concerns.   Review of Systems  All other systems reviewed and are negative.      Objective:   Physical Exam General: AAO x3, NAD  Dermatological: Skin is warm, dry and supple bilateral. Nails x 10 are well manicured; remaining integument appears unremarkable at this time. There are no open sores, no preulcerative lesions, no rash or signs of infection present.  Vascular: Dorsalis Pedis artery and Posterior Tibial artery pedal pulses are 2/4 bilateral with immedate capillary fill time. No varicosities and no lower extremity edema present bilateral. There is no pain with calf compression, swelling, warmth, erythema.   Neruologic: Grossly intact via light touch bilateral. Vibratory intact via tuning fork bilateral. Protective threshold with Semmes Wienstein monofilament intact to all pedal sites bilateral.   Musculoskeletal: On the left foot there is decreased range of motion the first MTPJ and is a dorsal spur off the dorsal first metatarsal. There is tenderness the first MTPJ. There is trace edema to the area is no erythema or increase in warmth. There is no area of significant pinpoint tenderness otherwise. There is no other areas of tenderness. MMT 5/5  Assessment: Left first MTPJ capsulitis, hallux limitus  Plan: -Treatment options discussed including all  alternatives, risks, and complications -Etiology of symptoms were discussed -X-rays were obtained and reviewed with the patient. 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 No evidence of acute fracture. -At this time she would like to proceed with a steroid injection today. Insert conditions a mixture Kenalog locally onset was infiltrated into and around the first MTPJ left foot without complications. Post injection care was discussed. -I discussed an orthotic with a first ray cut out the hallux limitus to help prevent pain as well as worsening. This is not guarantee however she will try this. She was measured orthotics are sent to [facility].  -In the future may need to consider surgical intervention if the symptoms continue  -Follow-up in 3 weeks to pick up orthotics or sooner if needed.   Ovid CurdMatthew Wagoner, DPM

## 2016-08-14 DIAGNOSIS — F4322 Adjustment disorder with anxiety: Secondary | ICD-10-CM | POA: Diagnosis not present

## 2016-08-21 ENCOUNTER — Telehealth: Payer: Self-pay | Admitting: Physician Assistant

## 2016-08-21 NOTE — Telephone Encounter (Signed)
Pt is needing to talk with Trena PlattStephanie English regarding her starting on an anxiety medication   Best number [phone]

## 2016-08-21 NOTE — Telephone Encounter (Signed)
07/23/16 last ov 

## 2016-08-25 ENCOUNTER — Telehealth: Payer: Self-pay | Admitting: Physician Assistant

## 2016-08-25 MED ORDER — FLUOXETINE HCL 20 MG PO TABS: 20.0000 mg | ORAL_TABLET | Freq: Every day | ORAL | 3 refills | Status: DC

## 2016-08-25 MED ORDER — ALPRAZOLAM 0.5 MG PO TABS: 0.2500 mg | ORAL_TABLET | Freq: Every day | ORAL | 0 refills | Status: DC | PRN

## 2016-08-25 NOTE — Telephone Encounter (Signed)
Patient is here today with husband.  She noted that she would like somethiing for her anxiety as they go to marital counseling.  She states that her heart was racing and she felt a symptoms of a panic attack while undergoing counseling.  She would prefer to not take a benzodiazepine at this time.  However her counseling is currently underway.  She will try the xanax 1-3 times, meanwhile we will bridge the prozac, as she states she would like something daily.

## 2016-08-26 NOTE — Telephone Encounter (Signed)
Script faxed to pharmacy

## 2016-08-28 DIAGNOSIS — Z1231 Encounter for screening mammogram for malignant neoplasm of breast: Secondary | ICD-10-CM | POA: Diagnosis not present

## 2016-08-28 DIAGNOSIS — R922 Inconclusive mammogram: Secondary | ICD-10-CM | POA: Diagnosis not present

## 2016-08-28 DIAGNOSIS — Z803 Family history of malignant neoplasm of breast: Secondary | ICD-10-CM | POA: Diagnosis not present

## 2016-08-28 DIAGNOSIS — N6019 Diffuse cystic mastopathy of unspecified breast: Secondary | ICD-10-CM | POA: Diagnosis not present

## 2016-09-03 DIAGNOSIS — F411 Generalized anxiety disorder: Secondary | ICD-10-CM | POA: Diagnosis not present

## 2016-09-11 DIAGNOSIS — F411 Generalized anxiety disorder: Secondary | ICD-10-CM | POA: Diagnosis not present

## 2016-09-12 ENCOUNTER — Encounter: Payer: Self-pay | Admitting: Podiatry

## 2016-09-12 ENCOUNTER — Ambulatory Visit (INDEPENDENT_AMBULATORY_CARE_PROVIDER_SITE_OTHER): Payer: BLUE CROSS/BLUE SHIELD | Admitting: Podiatry

## 2016-09-12 DIAGNOSIS — M722 Plantar fascial fibromatosis: Secondary | ICD-10-CM | POA: Diagnosis not present

## 2016-09-12 DIAGNOSIS — M205X2 Other deformities of toe(s) (acquired), left foot: Secondary | ICD-10-CM | POA: Diagnosis not present

## 2016-09-12 DIAGNOSIS — M779 Enthesopathy, unspecified: Secondary | ICD-10-CM | POA: Diagnosis not present

## 2016-09-12 NOTE — Patient Instructions (Addendum)
WEARING INSTRUCTIONS FOR ORTHOTICS  Don't expect to be comfortable wearing your orthotic devices for the first time.  Like eyeglasses, you may be aware of them as time passes, they will not be uncomfortable and you will enjoy wearing them.  FOLLOW THESE INSTRUCTIONS EXACTLY!  1. Wear your orthotic devices for:       Not more than 1 hour the first day.       Not more than 2 hours the second day.       Not more than 3 hours the third day and so on.        Or wear them for as long as they feel comfortable.       If you experience discomfort in your feet or legs take them out.  When feet & legs feel       better, put them back in.  You do need to be consistent and wear them a little        everyday. 2.   If at any time the orthotic devices become acutely uncomfortable before the       time for that particular day, STOP WEARING THEM. 3.   On the next day, do not increase the wearing time. 4.   Subsequently, increase the wearing time by 15-30 minutes only if comfortable to do       so. 5.   You will be seen by your doctor about 2-4 weeks after you receive your orthotic       devices, at which time you will probably be wearing your devices comfortably        for about 8 hours or more a day. 6.   Some patients occasionally report mild aches or discomfort in other parts of the of       body such as the knees, hips or back after 3 or 4 consecutive hours of wear.  If this       is the case with you, do not extend your wearing time.  Instead, cut it back an hour or       two.  In all likelihood, these symptoms will disappear in a short period of time as your       body posture realigns itself and functions more efficiently. 7.   It is possible that your orthotic device may require some small changes or adjustment       to improve their function or make them more comfortable.   This is usually not done       before one to three months have elapsed.  These adjustments are made in        accordance  with the changed position your feet are assuming as a result of       improved biomechanical function. 8.   In women's shoes, it's not unusual for your heel to slip out of the shoe, particularly if       they are step-in-shoes.  If this is the case, try other shoes or other styles.  Try to       purchase shoes which have deeper heal seats or higher heel counters. 9.   Squeaking of orthotics devices in the shoes is due to the movement of the devices       when they are functioning normally.  To eliminate squeaking, simply dust some       baby powder into your shoes before inserting the devices.  If this does not work,          apply soap or wax to the edges of the orthotic devices or put a tissue into the shoes. 10. It is important that you follow these directions explicitly.  Failure to do so will simply       prolong the adjustment period or create problems which are easily avoided.  It makes       no difference if you are wearing your orthotic devices for only a few hours after        several months, so long as you are wearing them comfortably for those hours. 11. If you have any questions or complaints, contact our office.  We have no way of       knowing about your problems unless you tell us.  If we do not hear from you, we will       assume that you are proceeding well.   Plantar Fasciitis Rehab Ask your health care provider which exercises are safe for you. Do exercises exactly as told by your health care provider and adjust them as directed. It is normal to feel mild stretching, pulling, tightness, or discomfort as you do these exercises, but you should stop right away if you feel sudden pain or your pain gets worse. Do not begin these exercises until told by your health care provider. Stretching and range of motion exercises These exercises warm up your muscles and joints and improve the movement and flexibility of your foot. These exercises also help to relieve pain. Exercise A: Plantar  fascia stretch  1. Sit with your left / right leg crossed over your opposite knee. 2. Hold your heel with one hand with that thumb near your arch. With your other hand, hold your toes and gently pull them back toward the top of your foot. You should feel a stretch on the bottom of your toes or your foot or both. 3. Hold this stretch for__________ seconds. 4. Slowly release your toes and return to the starting position. Repeat __________ times. Complete this exercise __________ times a day. Exercise B: Gastroc, standing  1. Stand with your hands against a wall. 2. Extend your left / right leg behind you, and bend your front knee slightly. 3. Keeping your heels on the floor and keeping your back knee straight, shift your weight toward the wall without arching your back. You should feel a gentle stretch in your left / right calf. 4. Hold this position for __________ seconds. Repeat __________ times. Complete this exercise __________ times a day. Exercise C: Soleus, standing 1. Stand with your hands against a wall. 2. Extend your left / right leg behind you, and bend your front knee slightly. 3. Keeping your heels on the floor, bend your back knee and slightly shift your weight over the back leg. You should feel a gentle stretch deep in your calf. 4. Hold this position for __________ seconds. Repeat __________ times. Complete this exercise __________ times a day. Exercise D: Gastrocsoleus, standing 1. Stand with the ball of your left / right foot on a step. The ball of your foot is on the walking surface, right under your toes. 2. Keep your other foot firmly on the same step. 3. Hold onto the wall or a railing for balance. 4. Slowly lift your other foot, allowing your body weight to press your heel down over the edge of the step. You should feel a stretch in your left / right calf. 5. Hold this position for __________ seconds. 6. Return both feet to the step.  7. Repeat this exercise with a  slight bend in your left / right knee. Repeat __________ times with your left / right knee straight and __________ times with your left / right knee bent. Complete this exercise __________ times a day. Balance exercise This exercise builds your balance and strength control of your arch to help take pressure off your plantar fascia. Exercise E: Single leg stand 1. Without shoes, stand near a railing or in a doorway. You may hold onto the railing or door frame as needed. 2. Stand on your left / right foot. Keep your big toe down on the floor and try to keep your arch lifted. Do not let your foot roll inward. 3. Hold this position for __________ seconds. 4. If this exercise is too easy, you can try it with your eyes closed or while standing on a pillow. Repeat __________ times. Complete this exercise __________ times a day. This information is not intended to replace advice given to you by your health care provider. Make sure you discuss any questions you have with your health care provider. Document Released: 03/03/2005 Document Revised: 11/06/2015 Document Reviewed: 01/15/2015 Elsevier Interactive Patient Education  2018 [employer].

## 2016-09-22 NOTE — Progress Notes (Signed)
Subjective: 51 year old female presents the office with a pickup orthotics. She states that the injection did help the first MPJ. She soaks them in the area but she is also states she gets some pain in the heels well. Denies any recent injury or trauma. She denies any swelling redness and she has no other complaints today. Denies any systemic complaints such as fevers, chills, nausea, vomiting. No acute changes since last appointment, and no other complaints at this time.   Objective: AAO x3, NAD DP/PT pulses palpable bilaterally, CRT less than 3 seconds Tenderness to palpation along the plantar medial tubercle of the calcaneus at the insertion of plantar fascia on the left foot. There is no pain along the course of the plantar fascia within the arch of the foot. Plantar fascia appears to be intact. There is no pain with lateral compression of the calcaneus or pain with vibratory sensation. There is no pain along the course or insertion of the achilles tendon. Decrease in motion of first MTPJ and the left side there is minimal tenderness on patient this area. No swelling or redness or drainage. No other areas of tenderness to bilateral lower extremities. No open lesions or pre-ulcerative lesions.  No pain with calf compression, swelling, warmth, erythema  Assessment: Left foot hallux limitus, plantar fasciitis  Plan: -All treatment options discussed with the patient including all alternatives, risks, complications.  -She wishes to pursue another injection today. A mixture of Kenalog and local anesthetic was infiltrated into the plantar medial tubercle of the calcaneus at insertion of plantar fascial also on the first MTPJ. Post injection care was discussed. This was completed without complications today. -Orthotics are dispensed today. Oral and written break in instructions were discussed. Also discussed shoe gear modifications. -Ice -Stretching exercises.  -Patient encouraged to call the office  with any questions, concerns, change in symptoms.   Ovid CurdMatthew Wagoner, DPM

## 2016-09-25 ENCOUNTER — Other Ambulatory Visit: Payer: Self-pay | Admitting: Physician Assistant

## 2016-09-25 DIAGNOSIS — F411 Generalized anxiety disorder: Secondary | ICD-10-CM | POA: Diagnosis not present

## 2016-09-25 DIAGNOSIS — R519 Headache, unspecified: Secondary | ICD-10-CM

## 2016-09-25 DIAGNOSIS — R51 Headache: Principal | ICD-10-CM

## 2016-09-27 ENCOUNTER — Telehealth: Payer: Self-pay | Admitting: Radiology

## 2016-09-27 NOTE — Telephone Encounter (Signed)
Patient called in today wanting to know about reason for denial of Zofran. I can not tell why it was declined, but it does appear as though patient needs to make appt to discuss reason for needing Zofran (? Migraine) in chart review I do not see a visit for this problem.

## 2016-10-03 DIAGNOSIS — Z1231 Encounter for screening mammogram for malignant neoplasm of breast: Secondary | ICD-10-CM | POA: Diagnosis not present

## 2016-10-03 DIAGNOSIS — Z803 Family history of malignant neoplasm of breast: Secondary | ICD-10-CM | POA: Diagnosis not present

## 2016-10-03 DIAGNOSIS — Z8041 Family history of malignant neoplasm of ovary: Secondary | ICD-10-CM | POA: Diagnosis not present

## 2016-10-03 DIAGNOSIS — R922 Inconclusive mammogram: Secondary | ICD-10-CM | POA: Diagnosis not present

## 2016-10-04 ENCOUNTER — Encounter: Payer: Self-pay | Admitting: Physician Assistant

## 2016-10-04 ENCOUNTER — Ambulatory Visit: Payer: BLUE CROSS/BLUE SHIELD | Admitting: Urgent Care

## 2016-10-04 ENCOUNTER — Ambulatory Visit (INDEPENDENT_AMBULATORY_CARE_PROVIDER_SITE_OTHER): Payer: BLUE CROSS/BLUE SHIELD | Admitting: Family Medicine

## 2016-10-04 VITALS — BP 129/83 | HR 83 | Temp 98.0°F | Resp 18 | Ht 65.35 in | Wt 169.0 lb

## 2016-10-04 DIAGNOSIS — J329 Chronic sinusitis, unspecified: Secondary | ICD-10-CM

## 2016-10-04 DIAGNOSIS — R519 Headache, unspecified: Secondary | ICD-10-CM

## 2016-10-04 DIAGNOSIS — R51 Headache: Secondary | ICD-10-CM | POA: Diagnosis not present

## 2016-10-04 DIAGNOSIS — R110 Nausea: Secondary | ICD-10-CM | POA: Diagnosis not present

## 2016-10-04 DIAGNOSIS — J310 Chronic rhinitis: Secondary | ICD-10-CM

## 2016-10-04 MED ORDER — OLOPATADINE HCL 0.6 % NA SOLN: NASAL | 2 refills | Status: DC

## 2016-10-04 MED ORDER — PREDNISONE 20 MG PO TABS: ORAL_TABLET | ORAL | 0 refills | Status: DC

## 2016-10-04 MED ORDER — ONDANSETRON 8 MG PO TBDP: 8.0000 mg | ORAL_TABLET | Freq: Three times a day (TID) | ORAL | 1 refills | Status: DC | PRN

## 2016-10-04 NOTE — Patient Instructions (Addendum)
Continue your current medicine regimen  Try to avoid sniffing too vigorously when you use the fluticasone nose spray (Flonase)  Try adding the Patanol nose spray 1 spray each nostril twice daily. Discontinue if it causes nasal irritation.  If you continue to have the stuffiness and pressure in your head take the prescribed course of prednisone. 3 pills daily for 2 days, then 2 daily for 2 days, then 1 daily for 2 days  Return or go see the ENT doctor again if symptoms continue to persist too much.    IF you received an x-ray today, you will receive an invoice from [facility]. Please contact [facility] at [phone] with questions or concerns regarding your invoice.   IF you received labwork today, you will receive an invoice from [facility]. Please contact [facility] at [phone] with questions or concerns regarding your invoice.   Our billing staff will not be able to assist you with questions regarding bills from these companies.  You will be contacted with the lab results as soon as they are available. The fastest way to get your results is to activate your My Chart account. Instructions are located on the last page of this paperwork. If you have not heard from us regarding the results in 2 weeks, please contact this office.

## 2016-10-04 NOTE — Progress Notes (Signed)
Patient ID: Jackie Ellis, female    DOB: March 08, 1966  Age: 51 y.o. MRN: [id number]  Chief Complaint  Patient presents with  . Dizziness    for the past few days  . Medication Refill    Zofran     Subjective:  51 year old lady who usually sees [place] PA. However due to a scheduling error we made a swab of patients saw her today. The patient is here needing a refill on her ondansetron. She has had stomach surgeries. Actually it is her migraines and give her the nausea, and she usually takes it couple times a week when she has a migraine. She uses Maxalt for migraine and that is helped.  She also has chronic headache congestion and facial congestion with a long history of sinusitis and rhinitis, but it's been especially bad the last few days again. She uses her Flonase and takes Zyrtec. She has a chronic dripping sound in her left ear. She saw an ENT last year, nothing definitive was found.  Current allergies, medications, problem list, past/family and social histories reviewed.  Objective:  BP 129/83   Pulse 83   Temp 98 F (36.7 C) (Oral)   Resp 18   Ht 5' 5.35" (1.66 m)   Wt 169 lb (76.7 kg)   SpO2 100%   BMI 27.82 kg/m   No major distress. TMs entirely normal. Has tenderness over the maxillary sinus more than the frontal, more on the left than the right. Tender at the bridge of the nose. Nose not inflamed. Throat clear. Neck supple without nodes. Chest clear. Heart regular without murmur. Abdomen soft without mass or tenderness.  Assessment & Plan:   Assessment: 1. Nausea without vomiting   2. Intractable headache, unspecified chronicity pattern, unspecified headache type   3. Rhinosinusitis       Plan: See instructions. We'll try the Patanase and see if that helps. She apparently did not tolerate the astelazine in one of the other combination nasal products in the past, but we will try this one.  No orders of the defined types were placed in this  encounter.   Meds ordered this encounter  Medications  . ondansetron (ZOFRAN-ODT) 8 MG disintegrating tablet    Sig: Take 1 tablet (8 mg total) by mouth every 8 (eight) hours as needed for nausea.    Dispense:  30 tablet    Refill:  1  . Olopatadine HCl 0.6 % SOLN    Sig: Use 1 spray in each nostril twice daily    Dispense:  1 Bottle    Refill:  2  . predniSONE (DELTASONE) 20 MG tablet    Sig: Take 3 pills daily for 2 days, then 2 daily for 2 days, then 1 daily for 2 days. Best taken in the morning after breakfast.    Dispense:  12 tablet    Refill:  0    Do not fill after 10/25/16         Patient Instructions   Continue your current medicine regimen  Try to avoid sniffing too vigorously when you use the fluticasone nose spray (Flonase)  Try adding the Patanol nose spray 1 spray each nostril twice daily. Discontinue if it causes nasal irritation.  If you continue to have the stuffiness and pressure in your head take the prescribed course of prednisone. 3 pills daily for 2 days, then 2 daily for 2 days, then 1 daily for 2 days  Return or go see the ENT doctor again if  symptoms continue to persist too much.    IF you received an x-ray today, you will receive an invoice from [facility]. Please contact [facility] at [phone] with questions or concerns regarding your invoice.   IF you received labwork today, you will receive an invoice from [facility]. Please contact [facility] at [phone] with questions or concerns regarding your invoice.   Our billing staff will not be able to assist you with questions regarding bills from these companies.  You will be contacted with the lab results as soon as they are available. The fastest way to get your results is to activate your My Chart account. Instructions are located on the last page of this paperwork. If you have not heard from us regarding the results in 2 weeks, please contact this office.          Return if symptoms worsen or fail to improve.   Moo Gravley, MD 10/04/2016

## 2016-10-10 ENCOUNTER — Ambulatory Visit: Payer: BLUE CROSS/BLUE SHIELD | Admitting: Podiatry

## 2016-10-14 DIAGNOSIS — Z1379 Encounter for other screening for genetic and chromosomal anomalies: Secondary | ICD-10-CM | POA: Insufficient documentation

## 2016-10-17 ENCOUNTER — Ambulatory Visit: Payer: BLUE CROSS/BLUE SHIELD | Admitting: Podiatry

## 2016-10-23 ENCOUNTER — Telehealth: Payer: Self-pay | Admitting: Physician Assistant

## 2016-10-23 NOTE — Telephone Encounter (Signed)
Called and spoke with patient. Advised her to come in to be seen. Patient prefers to only see Trena PlattStephanie English, PA. Patient states she will see whoever if absolutely necessary. States she will call back after her work meeting to see English's availability and schedule an appt.

## 2016-10-23 NOTE — Telephone Encounter (Signed)
Pt states she has swelling in both her feet, since Tuesday. She wanted to know if she should come in.  Offer the pt an appt today but she said no because it would not be with [place] (she was booked)  Contact number [phone]

## 2016-11-06 DIAGNOSIS — Z9884 Bariatric surgery status: Secondary | ICD-10-CM | POA: Diagnosis not present

## 2016-11-06 DIAGNOSIS — K912 Postsurgical malabsorption, not elsewhere classified: Secondary | ICD-10-CM | POA: Diagnosis not present

## 2016-11-12 DIAGNOSIS — K210 Gastro-esophageal reflux disease with esophagitis: Secondary | ICD-10-CM | POA: Diagnosis not present

## 2016-11-12 DIAGNOSIS — Z9884 Bariatric surgery status: Secondary | ICD-10-CM | POA: Diagnosis not present

## 2016-11-12 DIAGNOSIS — R1013 Epigastric pain: Secondary | ICD-10-CM | POA: Diagnosis not present

## 2016-11-12 DIAGNOSIS — R1011 Right upper quadrant pain: Secondary | ICD-10-CM | POA: Diagnosis not present

## 2016-11-22 DIAGNOSIS — K828 Other specified diseases of gallbladder: Secondary | ICD-10-CM | POA: Diagnosis not present

## 2016-11-22 DIAGNOSIS — R1011 Right upper quadrant pain: Secondary | ICD-10-CM | POA: Diagnosis not present

## 2016-12-03 DIAGNOSIS — R1084 Generalized abdominal pain: Secondary | ICD-10-CM | POA: Diagnosis not present

## 2016-12-03 DIAGNOSIS — R1013 Epigastric pain: Secondary | ICD-10-CM | POA: Diagnosis not present

## 2016-12-18 ENCOUNTER — Other Ambulatory Visit: Payer: Self-pay | Admitting: Family Medicine

## 2016-12-18 DIAGNOSIS — R519 Headache, unspecified: Secondary | ICD-10-CM

## 2016-12-18 DIAGNOSIS — R51 Headache: Principal | ICD-10-CM

## 2016-12-21 ENCOUNTER — Other Ambulatory Visit: Payer: Self-pay | Admitting: Physician Assistant

## 2016-12-21 DIAGNOSIS — R519 Headache, unspecified: Secondary | ICD-10-CM

## 2016-12-21 DIAGNOSIS — R51 Headache: Principal | ICD-10-CM

## 2017-01-31 ENCOUNTER — Encounter: Payer: Self-pay | Admitting: Physician Assistant

## 2017-01-31 ENCOUNTER — Ambulatory Visit: Payer: BLUE CROSS/BLUE SHIELD | Admitting: Physician Assistant

## 2017-01-31 ENCOUNTER — Other Ambulatory Visit: Payer: Self-pay

## 2017-01-31 VITALS — BP 124/70 | HR 61 | Temp 98.6°F | Resp 16 | Ht 65.0 in | Wt 167.2 lb

## 2017-01-31 DIAGNOSIS — G629 Polyneuropathy, unspecified: Secondary | ICD-10-CM

## 2017-01-31 DIAGNOSIS — K829 Disease of gallbladder, unspecified: Secondary | ICD-10-CM

## 2017-01-31 DIAGNOSIS — M1990 Unspecified osteoarthritis, unspecified site: Secondary | ICD-10-CM | POA: Diagnosis not present

## 2017-01-31 DIAGNOSIS — F411 Generalized anxiety disorder: Secondary | ICD-10-CM

## 2017-01-31 MED ORDER — GABAPENTIN 300 MG PO CAPS: 300.0000 mg | ORAL_CAPSULE | Freq: Every day | ORAL | 5 refills | Status: DC

## 2017-01-31 MED ORDER — CLONAZEPAM 0.5 MG PO TABS: 0.5000 mg | ORAL_TABLET | Freq: Every day | ORAL | 1 refills | Status: DC

## 2017-01-31 NOTE — Patient Instructions (Signed)
     IF you received an x-ray today, you will receive an invoice from [facility]. Please contact [facility] at [phone] with questions or concerns regarding your invoice.   IF you received labwork today, you will receive an invoice from [facility]. Please contact [facility] at [phone] with questions or concerns regarding your invoice.   Our billing staff will not be able to assist you with questions regarding bills from these companies.  You will be contacted with the lab results as soon as they are available. The fastest way to get your results is to activate your My Chart account. Instructions are located on the last page of this paperwork. If you have not heard from us regarding the results in 2 weeks, please contact this office.     

## 2017-01-31 NOTE — Progress Notes (Signed)
PRIMARY CARE AT [facility] [address], [place] [address] [phone]  Date:  01/31/2017   Name:  Jackie Ellis   DOB:  07-18-1965   MRN:  [id number]  PCP:  Joretta Bachelor, PA    History of Present Illness:  Jackie Ellis is a 51 y.o. female patient who presents to PCP with  Chief Complaint  Patient presents with  . Back Pain    mid to lower back x 2 wks, depression scale during triage, score 13  . bloodwork    liver enzymes     Patient is here for low back pain that started 2 weeks ago.  She has no swelling or erythema.  It is not worsened with inspiration.  She denies any trauma.  No sob or dyspnea.  This is more present at her left mid back at the ribs.   She states that since 3 months ago, her husband of 2 years, had left.  This was without warning or any altercation or event.  She has noticed increased anxiety, fatigue, insomnia, loss of interest.  She denies si/hi.  The most stressful has been the financial burden that he had left.  She has recently moved, and is getting herself on track.  She has not sought counseling at this time.   She would also like to recheck her liver enzymes.  She has gallbladder disease, and is set for surgical intervention however she has delayed since this has occurred with her relationships.  There is pain associated with eating.  Some nausea.  This is not out of control at this time.  No regurge or fever that she can note at this time. She continues to walk as her exercise.  She also complains in the last month of neuropathy of pins and needles in her feet bilaterally at night.  This can also wake her sleep.  Walking and standing can help relieve these symptoms.  She takes gabapentin for neck pain off and on but has not in recent month.  Patient Active Problem List   Diagnosis Date Noted  . Abdominal pain, acute, generalized 03/05/2016  . 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 Acid reflux  10/05/2015  . Gastroesophageal reflux disease with esophagitis 09/19/2015  . Migraine 07/31/2015  . Morbid obesity ([place]) 07/31/2015  . Family history of breast cancer in sister 07/21/2015  . 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 History of removal of laparoscopic gastric banding device 07/18/2015  . Knee osteoarthritis 07/18/2015  . Metatarsal stress fracture 03/08/2012    Past Medical History:  Diagnosis Date  . Allergy   . GERD (gastroesophageal reflux disease)     Past Surgical History:  Procedure Laterality Date  . ABDOMINAL HYSTERECTOMY    . GASTRECTOMY    . TUBAL LIGATION    . TUBAL LIGATION      Social History   Tobacco Use  . Smoking status: Never Smoker  . Smokeless tobacco: Never Used  Substance Use Topics  . Alcohol use: No    Alcohol/week: 0.0 oz  . Drug use: No    Family History  Problem Relation Age of Onset  . Diabetes Mother   . Hypertension Mother   . Diabetes Father   . Hypertension Father   . Cancer Sister   . Diabetes Brother   . Hypertension Brother   . Hyperlipidemia Brother     Allergies  Allergen Reactions  . Shellfish Allergy Anaphylaxis  . Aspirin   . Other  Itching    Multiple fruits and certain seeds -itching and rash  . Sulfa Antibiotics Other (See Comments)    blisters  . Codeine Rash  . Penicillins Rash    Medication list has been reviewed and updated.  Current Outpatient Medications on File Prior to Visit  Medication Sig Dispense Refill  . ALPRAZolam (XANAX) 0.5 MG tablet Take 0.5 tablets (0.25 mg total) by mouth daily as needed for anxiety. 2 tablet 0  . cetirizine (ZYRTEC) 10 MG tablet Take 1 tablet (10 mg total) by mouth daily. 30 tablet 5  . cloNIDine (CATAPRES) 0.1 MG tablet   2  . diclofenac sodium (VOLTAREN) 1 % GEL Apply 4 g topically 4 (four) times daily. 100 g 0  . FLUoxetine (PROZAC) 20 MG tablet Take 1 tablet (20 mg total) by mouth daily. Take half tablet daily for the first week, then increase to 1 full  tablet daily 30 tablet 3  . fluticasone (FLONASE) 50 MCG/ACT nasal spray Place 2 sprays into both nostrils daily. 16 g 5  . Multiple Vitamin (MULTIVITAMIN) tablet Take 1 tablet by mouth daily.    . Olopatadine HCl 0.6 % SOLN Use 1 spray in each nostril twice daily 1 Bottle 2  . pantoprazole (PROTONIX) 40 MG tablet Take 40 mg by mouth daily.    . rizatriptan (MAXALT-MLT) 5 MG disintegrating tablet DISSOLVE ONE TABLET BY MOUTH BY MOUTH AS NEEDED FOR MIGRAINE. MAY REPEAT IN 2 HOURS AS NEEDED. MAXIMUM OF 6 TABLETS IN 24 HOURS 10 tablet 5  . tizanidine (ZANAFLEX) 2 MG capsule Take 1 capsule (2 mg total) by mouth every 6 (six) hours as needed for muscle spasms. 30 capsule 2  . polyethylene glycol (MIRALAX / GLYCOLAX) packet Take 17 g by mouth daily.     No current facility-administered medications on file prior to visit.     ROS ROS otherwise unremarkable unless listed above.  Physical Examination: BP 124/70   Pulse 61   Temp 98.6 F (37 C) (Oral)   Resp 16   Ht _0  (1.651 m)   Wt 167 lb 3.2 oz (75.8 kg)   SpO2 99%   BMI 27.82 kg/m  Ideal Body Weight: Weight in (lb) to have BMI = 25: 149.9  Physical Exam  Constitutional: She is oriented to person, place, and time. She appears well-developed and well-nourished. No distress.  HENT:  Head: Normocephalic and atraumatic.  Right Ear: External ear normal.  Left Ear: External ear normal.  Eyes: Conjunctivae and EOM are normal. Pupils are equal, round, and reactive to light.  Cardiovascular: Normal rate.  Pulmonary/Chest: Effort normal. No respiratory distress.  Abdominal: Soft. Normal appearance and bowel sounds are normal. There is tenderness in the right upper quadrant.  Neurological: She is alert and oriented to person, place, and time.  Skin: She is not diaphoretic.  Psychiatric: She has a normal mood and affect. Her behavior is normal.     Assessment and Plan: Jackie Ellis is a 51 y.o. female who is here today for cc  of  Chief Complaint  Patient presents with  . Back Pain    mid to lower back x 2 wks, depression scale during triage, score 13  . bloodwork    liver enzymes  --advised of dangers of gallbladder rupture and complication of disease and alarming sxs to be aware. --rechecking vitamin D at this time.  --starting klonopin for nightime.  Stopping xanax.  Restarting the gabapentin at night.    Gallbladder disease -  Plan: CMP14+EGFR  Arthritis - Plan: VITAMIN D 25 Hydroxy (Vit-D Deficiency, Fractures)  Neuropathy - Plan: gabapentin (NEURONTIN) 300 MG capsule  Anxiety state - Plan: clonazePAM (KLONOPIN) 0.5 MG tablet  Ivar Drape, PA-C [facility] 11/24/20189:16 AM

## 2017-02-02 ENCOUNTER — Other Ambulatory Visit: Payer: Self-pay | Admitting: Physician Assistant

## 2017-02-02 ENCOUNTER — Encounter: Payer: Self-pay | Admitting: Physician Assistant

## 2017-02-02 DIAGNOSIS — M25551 Pain in right hip: Secondary | ICD-10-CM

## 2017-02-02 DIAGNOSIS — M25552 Pain in left hip: Secondary | ICD-10-CM

## 2017-02-02 LAB — CMP14+EGFR
A/G RATIO: 1.6 (ref 1.2–2.2)
ALBUMIN: 4.1 g/dL (ref 3.5–5.5)
ALK PHOS: 76 IU/L (ref 39–117)
ALT: 47 IU/L — ABNORMAL HIGH (ref 0–32)
AST: 33 IU/L (ref 0–40)
BILIRUBIN TOTAL: 0.4 mg/dL (ref 0.0–1.2)
BUN / CREAT RATIO: 14 (ref 9–23)
BUN: 10 mg/dL (ref 6–24)
CHLORIDE: 102 mmol/L (ref 96–106)
CO2: 25 mmol/L (ref 20–29)
Calcium: 9.4 mg/dL (ref 8.7–10.2)
Creatinine, Ser: 0.71 mg/dL (ref 0.57–1.00)
GFR calc non Af Amer: 99 mL/min/{1.73_m2} (ref >59)
GFR, EST AFRICAN AMERICAN: 114 mL/min/{1.73_m2} (ref >59)
Globulin, Total: 2.6 g/dL (ref 1.5–4.5)
Glucose: 83 mg/dL (ref 65–99)
POTASSIUM: 4 mmol/L (ref 3.5–5.2)
SODIUM: 140 mmol/L (ref 134–144)
TOTAL PROTEIN: 6.7 g/dL (ref 6.0–8.5)

## 2017-02-02 LAB — VITAMIN D 25 HYDROXY (VIT D DEFICIENCY, FRACTURES): Vit D, 25-Hydroxy: 33.8 ng/mL (ref 30.0–100.0)

## 2017-02-02 MED ORDER — DICLOFENAC SODIUM 1 % TD GEL: 4.0000 g | Freq: Four times a day (QID) | TRANSDERMAL | 0 refills | Status: DC

## 2017-02-08 ENCOUNTER — Encounter: Payer: Self-pay | Admitting: Physician Assistant

## 2017-02-17 ENCOUNTER — Telehealth: Payer: Self-pay | Admitting: Podiatry

## 2017-02-17 NOTE — Telephone Encounter (Signed)
Left vm for pt to call to schedule appt

## 2017-02-21 ENCOUNTER — Ambulatory Visit: Payer: BLUE CROSS/BLUE SHIELD | Admitting: Physician Assistant

## 2017-02-21 ENCOUNTER — Other Ambulatory Visit: Payer: Self-pay

## 2017-02-21 VITALS — BP 116/66 | HR 72 | Temp 98.0°F | Resp 16 | Ht 65.0 in | Wt 165.0 lb

## 2017-02-21 DIAGNOSIS — F411 Generalized anxiety disorder: Secondary | ICD-10-CM

## 2017-02-21 DIAGNOSIS — K829 Disease of gallbladder, unspecified: Secondary | ICD-10-CM

## 2017-02-21 NOTE — Patient Instructions (Addendum)
  Please use the ergonomic chair to its full use.   Please let me know if the clonazepam is good at 1mg .  Do not pick up the refill, and I will prescribe the 1mg  at bedtime.   Let me know if your back is not getting better, we should image it at that time.   See if you may want to push your surgery up to a sooner time.  I will contact you if I have any leads on the counseling.   IF you received an x-ray today, you will receive an invoice from [facility]. Please contact [facility] at [phone] with questions or concerns regarding your invoice.   IF you received labwork today, you will receive an invoice from [facility]. Please contact [facility] at [phone] with questions or concerns regarding your invoice.   Our billing staff will not be able to assist you with questions regarding bills from these companies.  You will be contacted with the lab results as soon as they are available. The fastest way to get your results is to activate your My Chart account. Instructions are located on the last page of this paperwork. If you have not heard from us regarding the results in 2 weeks, please contact this office.

## 2017-02-21 NOTE — Progress Notes (Signed)
PRIMARY CARE AT [facility] [address], [place] [phone]  Date:  02/21/2017   Name:  Jackie Norrisdrienne Merle-Gilliam   DOB:  1966-03-02   MRN:  [id number]  PCP:  Garnetta BuddyEnglish, Stephanie D, PA    History of Present Illness:  Jackie Ellis is a 51 y.o. female patient who presents to PCP with  Chief Complaint  Patient presents with  . Follow-up    gallbladder     Patient is here for follow up of gallbladder.  She notes that this is not improved, but it is a dull pain.  She is eating appropriately.  No fever or rash.   She is sleeping a little better with the xanax but she is sleeping for about 4 hours consistently, which is an improvement for before.  She continues to feel racing thoughts and bogged down with thought.   She continues to be estranged from her husband.    Patient Active Problem List   Diagnosis Date Noted  . Abdominal pain, acute, generalized 03/05/2016  . 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 Acid reflux 10/05/2015  . Gastroesophageal reflux disease with esophagitis 09/19/2015  . Migraine 07/31/2015  . Morbid obesity ([facility]) 07/31/2015  . Family history of breast cancer in sister 07/21/2015  . 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 History of removal of laparoscopic gastric banding device 07/18/2015  . Knee osteoarthritis 07/18/2015  . Metatarsal stress fracture 03/08/2012    Past Medical History:  Diagnosis Date  . Allergy   . GERD (gastroesophageal reflux disease)     Past Surgical History:  Procedure Laterality Date  . ABDOMINAL HYSTERECTOMY    . GASTRECTOMY    . TUBAL LIGATION    . TUBAL LIGATION      Social History   Tobacco Use  . Smoking status: Never Smoker  . Smokeless tobacco: Never Used  Substance Use Topics  . Alcohol use: No    Alcohol/week: 0.0 oz  . Drug use: No    Family History  Problem Relation Age of Onset  . Diabetes Mother   . Hypertension Mother   . Diabetes Father   .  Hypertension Father   . Cancer Sister   . Diabetes Brother   . Hypertension Brother   . Hyperlipidemia Brother     Allergies  Allergen Reactions  . Shellfish Allergy Anaphylaxis  . Aspirin   . Other Itching    Multiple fruits and certain seeds -itching and rash  . Sulfa Antibiotics Other (See Comments)    blisters  . Codeine Rash  . Penicillins Rash    Medication list has been reviewed and updated.  Current Outpatient Medications on File Prior to Visit  Medication Sig Dispense Refill  . ALPRAZolam (XANAX) 0.5 MG tablet Take 0.5 tablets (0.25 mg total) by mouth daily as needed for anxiety. 2 tablet 0  . cetirizine (ZYRTEC) 10 MG tablet Take 1 tablet (10 mg total) by mouth daily. 30 tablet 5  . clonazePAM (KLONOPIN) 0.5 MG tablet Take 1 tablet (0.5 mg total) at bedtime by mouth. 30 tablet 1  . cloNIDine (CATAPRES) 0.1 MG tablet   2  . diclofenac sodium (VOLTAREN) 1 % GEL Apply 4 g 4 (four) times daily topically. 100 g 0  . FLUoxetine (PROZAC) 20 MG tablet Take 1 tablet (20 mg total) by mouth daily. Take half tablet daily for the first week, then increase to 1 full tablet daily 30 tablet 3  . fluticasone (FLONASE) 50 MCG/ACT  nasal spray Place 2 sprays into both nostrils daily. 16 g 5  . gabapentin (NEURONTIN) 300 MG capsule Take 1 capsule (300 mg total) at bedtime by mouth. 30 capsule 5  . Multiple Vitamin (MULTIVITAMIN) tablet Take 1 tablet by mouth daily.    . Olopatadine HCl 0.6 % SOLN Use 1 spray in each nostril twice daily 1 Bottle 2  . pantoprazole (PROTONIX) 40 MG tablet Take 40 mg by mouth daily.    . polyethylene glycol (MIRALAX / GLYCOLAX) packet Take 17 g by mouth daily.    . rizatriptan (MAXALT-MLT) 5 MG disintegrating tablet DISSOLVE ONE TABLET BY MOUTH BY MOUTH AS NEEDED FOR MIGRAINE. MAY REPEAT IN 2 HOURS AS NEEDED. MAXIMUM OF 6 TABLETS IN 24 HOURS 10 tablet 5  . tizanidine (ZANAFLEX) 2 MG capsule Take 1 capsule (2 mg total) by mouth every 6 (six) hours as needed for  muscle spasms. 30 capsule 2   No current facility-administered medications on file prior to visit.     ROS ROS otherwise unremarkable unless listed above.  Physical Examination: BP 116/66   Pulse 72   Temp 98 F (36.7 C) (Oral)   Resp 16   Ht 5\' 5"  (1.651 m)   Wt 165 lb (74.8 kg)   SpO2 100%   BMI 27.46 kg/m  Ideal Body Weight: Weight in (lb) to have BMI = 25: 149.9  Physical Exam  Constitutional: She is oriented to person, place, and time. She appears well-developed and well-nourished. No distress.  HENT:  Head: Normocephalic and atraumatic.  Right Ear: External ear normal.  Left Ear: External ear normal.  Eyes: Conjunctivae and EOM are normal. Pupils are equal, round, and reactive to light.  Cardiovascular: Normal rate, regular rhythm, normal heart sounds and intact distal pulses. Exam reveals no friction rub.  No murmur heard. Pulmonary/Chest: Effort normal and breath sounds normal. No respiratory distress. She has no wheezes.  Neurological: She is alert and oriented to person, place, and time.  Skin: She is not diaphoretic.  Psychiatric: She has a normal mood and affect. Her behavior is normal.     Assessment and Plan: Jackie Ellis is a 51 y.o. female who is here today for cc of  Chief Complaint  Patient presents with  . Follow-up    gallbladder  --advised to take the full tablet at this time.  She will follow up with gastroenterologist at this time.  An After Visit Summary was printed and given to the patient.  Please use the ergonomic chair to its full use.   Please let me know if the clonazepam is good at 1mg .  Do not pick up the refill, and I will prescribe the 1mg  at bedtime.   Let me know if your back is not getting better, we should image it at that time.   See if you may want to push your surgery up to a sooner time.  I will contact you if I have any leads on the counseling.  .Anxiety state  Gallbladder disease  Trena PlattStephanie English,  PA-C [facility] 12/21/201811:03 AM

## 2017-03-04 ENCOUNTER — Encounter: Payer: Self-pay | Admitting: Physician Assistant

## 2017-03-04 ENCOUNTER — Other Ambulatory Visit: Payer: Self-pay | Admitting: Physician Assistant

## 2017-03-04 DIAGNOSIS — G4700 Insomnia, unspecified: Secondary | ICD-10-CM

## 2017-03-04 MED ORDER — CLONAZEPAM 1 MG PO TABS: 1.0000 mg | ORAL_TABLET | Freq: Every evening | ORAL | 2 refills | Status: DC | PRN

## 2017-03-09 DIAGNOSIS — B373 Candidiasis of vulva and vagina: Secondary | ICD-10-CM | POA: Diagnosis not present

## 2017-03-09 DIAGNOSIS — N390 Urinary tract infection, site not specified: Secondary | ICD-10-CM | POA: Diagnosis not present

## 2017-03-11 ENCOUNTER — Ambulatory Visit (INDEPENDENT_AMBULATORY_CARE_PROVIDER_SITE_OTHER): Payer: BLUE CROSS/BLUE SHIELD | Admitting: Podiatry

## 2017-03-11 DIAGNOSIS — M205X2 Other deformities of toe(s) (acquired), left foot: Secondary | ICD-10-CM

## 2017-03-11 DIAGNOSIS — M722 Plantar fascial fibromatosis: Secondary | ICD-10-CM | POA: Diagnosis not present

## 2017-03-12 NOTE — Progress Notes (Signed)
Subjective: Ms. I presents the office today forsley-Gilliam follow-up and continued pain.  She states that she is starting to have some continued pain to the arch as well as the heel and she is wanting to proceed with a steroid injection today.  She states that she still gets pain in the big toe joint but she does not want steroid injection today.  She knows that she is going to likely need to have surgery to his toes is coming more painful.  She feels that her orthotics to be adjusted to have more arch support.  She denies any recent injury or trauma she has no acute changes since I last saw her.  She has no other concerns today. Denies any systemic complaints such as fevers, chills, nausea, vomiting. No acute changes since last appointment, and no other complaints at this time.   Objective: AAO x3, NAD DP/PT pulses palpable bilaterally, CRT less than 3 seconds There is decreased range of motion first MTPJ on the left foot and there is bone spur present the dorsal joint.  Tenderness palpation of the joint there is crepitation with range of motion.  There is tenderness on the plantar medial tubercle of the calcaneus at the insertion of plantar fascia and mild discomfort on the medial band of infection on the arch of the foot.  Plantar fascia appears to be intact.  No pain with lateral compression of the calcaneus.  No pain on the Achilles tendon.  No area of tenderness. No open lesions or pre-ulcerative lesions.  No pain with calf compression, swelling, warmth, erythema  Assessment: Left foot plantar fasciitis with hallux limitus  Plan: -All treatment options discussed with the patient including all alternatives, risks, complications.  -Patient is requesting steroid injection for plantar fascia.  See procedure note below. -Orthotics were modified today.  Think adding more arch support as well as a Morton's extension will be beneficial for her. Rick modified her orthotics today for her -We discussed  surgical intervention for the first MPJ.  She likely do this in the spring.  We discussed a first MTPJ arthrodesis. -Follow-up at her discretion.  Call with any concerns.  She agrees this plan. -Patient encouraged to call the office with any questions, concerns, change in symptoms.   Procedure: Injection Tendon/Ligament Discussed alternatives, risks, complications and verbal consent was obtained.  Location: Left plantar fascia at the glabrous junction; medial approach. Skin Prep: Alcohol. Injectate: 1 cc 0.5% marcaine plain, 1 cc 0.5% Marcaine plain and, 1 cc kenalog 10. Disposition: Patient tolerated procedure well. Injection site dressed with a band-aid.  Post-injection care was discussed and return precautions discussed.    Vivi BarrackMatthew R Wagoner DPM

## 2017-03-15 ENCOUNTER — Encounter: Payer: Self-pay | Admitting: Physician Assistant

## 2017-04-18 ENCOUNTER — Ambulatory Visit: Payer: BLUE CROSS/BLUE SHIELD | Admitting: Physician Assistant

## 2017-04-18 ENCOUNTER — Other Ambulatory Visit: Payer: Self-pay

## 2017-04-18 ENCOUNTER — Encounter: Payer: Self-pay | Admitting: Physician Assistant

## 2017-04-18 VITALS — BP 100/68 | HR 63 | Temp 98.1°F | Resp 16 | Ht 65.0 in | Wt 164.6 lb

## 2017-04-18 DIAGNOSIS — B373 Candidiasis of vulva and vagina: Secondary | ICD-10-CM | POA: Diagnosis not present

## 2017-04-18 DIAGNOSIS — B3731 Acute candidiasis of vulva and vagina: Secondary | ICD-10-CM

## 2017-04-18 DIAGNOSIS — N898 Other specified noninflammatory disorders of vagina: Secondary | ICD-10-CM

## 2017-04-18 LAB — POCT URINALYSIS DIP (MANUAL ENTRY)
Glucose, UA: NEGATIVE mg/dL
Ketones, POC UA: NEGATIVE mg/dL
Leukocytes, UA: NEGATIVE
Nitrite, UA: NEGATIVE
PH UA: 6 (ref 5.0–8.0)
PROTEIN UA: NEGATIVE mg/dL
Urobilinogen, UA: 0.2 E.U./dL

## 2017-04-18 LAB — POCT WET + KOH PREP: TRICH BY WET PREP: ABSENT

## 2017-04-18 MED ORDER — FLUCONAZOLE 150 MG PO TABS: 150.0000 mg | ORAL_TABLET | ORAL | 0 refills | Status: DC

## 2017-04-18 NOTE — Patient Instructions (Addendum)
I will contact you with the lab results. Please take medication as prescribed.  You can use over the counter zinc oxide for pain relief (desitin--diaper rash cream)  Vaginal Yeast infection, Adult Vaginal yeast infection is a condition that causes soreness, swelling, and redness (inflammation) of the vagina. It also causes vaginal discharge. This is a common condition. Some women get this infection frequently. What are the causes? This condition is caused by a change in the normal balance of the yeast (candida) and bacteria that live in the vagina. This change causes an overgrowth of yeast, which causes the inflammation. What increases the risk? This condition is more likely to develop in:  Women who take antibiotic medicines.  Women who have diabetes.  Women who take birth control pills.  Women who are pregnant.  Women who douche often.  Women who have a weak defense (immune) system.  Women who have been taking steroid medicines for a long time.  Women who frequently wear tight clothing.  What are the signs or symptoms? Symptoms of this condition include:  White, thick vaginal discharge.  Swelling, itching, redness, and irritation of the vagina. The lips of the vagina (vulva) may be affected as well.  Pain or a burning feeling while urinating.  Pain during sex.  How is this diagnosed? This condition is diagnosed with a medical history and physical exam. This will include a pelvic exam. Your health care provider will examine a sample of your vaginal discharge under a microscope. Your health care provider may send this sample for testing to confirm the diagnosis. How is this treated? This condition is treated with medicine. Medicines may be over-the-counter or prescription. You may be told to use one or more of the following:  Medicine that is taken orally.  Medicine that is applied as a cream.  Medicine that is inserted directly into the vagina (suppository).  Follow  these instructions at home:  Take or apply over-the-counter and prescription medicines only as told by your health care provider.  Do not have sex until your health care provider has approved. Tell your sex partner that you have a yeast infection. That person should go to his or her health care provider if he or she develops symptoms.  Do not wear tight clothes, such as pantyhose or tight pants.  Avoid using tampons until your health care provider approves.  Eat more yogurt. This may help to keep your yeast infection from returning.  Try taking a sitz bath to help with discomfort. This is a warm water bath that is taken while you are sitting down. The water should only come up to your hips and should cover your buttocks. Do this 3-4 times per day or as told by your health care provider.  Do not douche.  Wear breathable, cotton underwear.  If you have diabetes, keep your blood sugar levels under control. Contact a health care provider if:  You have a fever.  Your symptoms go away and then return.  Your symptoms do not get better with treatment.  Your symptoms get worse.  You have new symptoms.  You develop blisters in or around your vagina.  You have blood coming from your vagina and it is not your menstrual period.  You develop pain in your abdomen. This information is not intended to replace advice given to you by your health care provider. Make sure you discuss any questions you have with your health care provider. Document Released: 12/11/2004 Document Revised: 08/15/2015 Document Reviewed: 09/04/2014  Elsevier Interactive Patient Education  [employer].    IF you received an x-ray today, you will receive an invoice from [facility]. Please contact [facility] at [phone] with questions or concerns regarding your invoice.   IF you received labwork today, you will receive an invoice from [facility]. Please contact [facility] at [phone] with  questions or concerns regarding your invoice.   Our billing staff will not be able to assist you with questions regarding bills from these companies.  You will be contacted with the lab results as soon as they are available. The fastest way to get your results is to activate your My Chart account. Instructions are located on the last page of this paperwork. If you have not heard from [place] regarding the results in 2 weeks, please contact this office.

## 2017-04-18 NOTE — Progress Notes (Signed)
PRIMARY CARE AT [facility] [address], [place] [address] [phone]  Date:  04/18/2017   Name:  Jackie Ellis   DOB:  1965-05-13   MRN:  [id number]  PCP:  Garnetta Buddy, PA    History of Present Illness:  Jackie Ellis is a 52 y.o. female patient who presents to PCP with  Chief Complaint  Patient presents with  . Vaginal Itching    Vaginal itching, burning x1 month. Saw fast med, given diflucan x2 and cipro, urgency went away, itching persists. History of unprotected intercourse before separaration from husband. No new soaps, detergents.   Urgency and dysuria where the urine is painful    Irritated vaginal.   No odor No discharge. Husband left suddenly 6 months ago, so there is some concern of infidelity.     Patient Active Problem List   Diagnosis Date Noted  . Abdominal pain, acute, generalized 03/05/2016  . 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 Acid reflux 10/05/2015  . Gastroesophageal reflux disease with esophagitis 09/19/2015  . Migraine 07/31/2015  . Morbid obesity (HCC) 07/31/2015  . Family history of breast cancer in sister 07/21/2015  . 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 History of removal of laparoscopic gastric banding device 07/18/2015  . Knee osteoarthritis 07/18/2015  . Metatarsal stress fracture 03/08/2012    Past Medical History:  Diagnosis Date  . Allergy   . GERD (gastroesophageal reflux disease)     Past Surgical History:  Procedure Laterality Date  . ABDOMINAL HYSTERECTOMY    . GASTRECTOMY    . TUBAL LIGATION    . TUBAL LIGATION      Social History   Tobacco Use  . Smoking status: Never Smoker  . Smokeless tobacco: Never Used  Substance Use Topics  . Alcohol use: No    Alcohol/week: 0.0 oz  . Drug use: No    Family History  Problem Relation Age of Onset  . Diabetes Mother   . Hypertension Mother   . Diabetes Father   . Hypertension Father   . Cancer Sister   .  Diabetes Brother   . Hypertension Brother   . Hyperlipidemia Brother     Allergies  Allergen Reactions  . Shellfish Allergy Anaphylaxis  . Aspirin   . Other Itching    Multiple fruits and certain seeds -itching and rash  . Sulfa Antibiotics Other (See Comments)    blisters  . Codeine Rash  . Penicillins Rash    Medication list has been reviewed and updated.  Current Outpatient Medications on File Prior to Visit  Medication Sig Dispense Refill  . cetirizine (ZYRTEC) 10 MG tablet Take 1 tablet (10 mg total) by mouth daily. 30 tablet 5  . clonazePAM (KLONOPIN) 1 MG tablet Take 1 tablet (1 mg total) by mouth at bedtime as needed for anxiety. 30 tablet 2  . cloNIDine (CATAPRES) 0.1 MG tablet   2  . diclofenac sodium (VOLTAREN) 1 % GEL Apply 4 g 4 (four) times daily topically. 100 g 0  . FLUoxetine (PROZAC) 20 MG tablet Take 1 tablet (20 mg total) by mouth daily. Take half tablet daily for the first week, then increase to 1 full tablet daily 30 tablet 3  . fluticasone (FLONASE) 50 MCG/ACT nasal spray Place 2 sprays into both nostrils daily. 16 g 5  . gabapentin (NEURONTIN) 300 MG capsule Take 1 capsule (300 mg total) at bedtime by mouth. 30 capsule 5  . Multiple Vitamin (MULTIVITAMIN) tablet  Take 1 tablet by mouth daily.    . Olopatadine HCl 0.6 % SOLN Use 1 spray in each nostril twice daily 1 Bottle 2  . pantoprazole (PROTONIX) 40 MG tablet Take 40 mg by mouth daily.    . rizatriptan (MAXALT-MLT) 5 MG disintegrating tablet DISSOLVE ONE TABLET BY MOUTH BY MOUTH AS NEEDED FOR MIGRAINE. MAY REPEAT IN 2 HOURS AS NEEDED. MAXIMUM OF 6 TABLETS IN 24 HOURS 10 tablet 5  . tizanidine (ZANAFLEX) 2 MG capsule Take 1 capsule (2 mg total) by mouth every 6 (six) hours as needed for muscle spasms. 30 capsule 2  . ALPRAZolam (XANAX) 0.5 MG tablet Take 0.5 tablets (0.25 mg total) by mouth daily as needed for anxiety. (Patient not taking: Reported on 04/18/2017) 2 tablet 0  . polyethylene glycol (MIRALAX /  GLYCOLAX) packet Take 17 g by mouth daily.     No current facility-administered medications on file prior to visit.     ROS ROS otherwise unremarkable unless listed above.  Physical Examination: BP 100/68   Pulse 63   Temp 98.1 F (36.7 C)   Resp 16   Ht 5\' 5"  (1.651 m)   Wt 164 lb 9.6 oz (74.7 kg)   SpO2 100%   BMI 27.39 kg/m  Ideal Body Weight: Weight in (lb) to have BMI = 25: 149.9  Physical Exam  Constitutional: She is oriented to person, place, and time. She appears well-developed and well-nourished. No distress.  HENT:  Head: Normocephalic and atraumatic.  Right Ear: External ear normal.  Left Ear: External ear normal.  Eyes: Conjunctivae and EOM are normal. Pupils are equal, round, and reactive to light.  Cardiovascular: Normal rate.  Pulmonary/Chest: Effort normal. No respiratory distress.  Genitourinary: Pelvic exam was performed with patient supine. Cervix exhibits no motion tenderness, no discharge and no friability. Vaginal discharge found.  Neurological: She is alert and oriented to person, place, and time.  Skin: She is not diaphoretic.  Psychiatric: She has a normal mood and affect. Her behavior is normal.    Results for orders placed or performed in visit on 04/18/17  POCT Wet + KOH Prep  Result Value Ref Range   Yeast by KOH Present (A) Absent   Yeast by wet prep Present (A) Absent   WBC by wet prep None (A) Few   Clue Cells Wet Prep HPF POC None None   Trich by wet prep Absent Absent   Bacteria Wet Prep HPF POC Too numerous to count  (A) Few   Epithelial Cells By [employer] Financial[employer]et Pref ([facility]) Moderate (A) None, Few, Too numerous to count   RBC,UR,HPF,POC None None RBC/hpf    Assessment and Plan: Ashok Norrisdrienne Mille-Gilliam is a 52 y.o. female who is here today for cc of  Chief Complaint  Patient presents with  . Vaginal Itching    Vaginal itching, burning x1 month. Saw fast med, given diflucan x2 and cipro, urgency went away, itching persists. History of  unprotected intercourse before separaration from husband. No new soaps, detergents.   Yeast vaginitis - Plan: fluconazole (DIFLUCAN) 150 MG tablet, POCT urinalysis dipstick, GC/Chlamydia Probe Amp, CBC, HIV antibody, RPR, GC/Chlamydia Probe Amp  Vaginal itching - Plan: POCT Wet + KOH Prep, GC/Chlamydia Probe Amp, GC/Chlamydia Probe Amp  Trena PlattStephanie , PA-C [facility] 2/10/20198:14 PM

## 2017-04-19 LAB — CBC
HEMOGLOBIN: 13.6 g/dL (ref 11.1–15.9)
Hematocrit: 39.6 % (ref 34.0–46.6)
MCH: 30.3 pg (ref 26.6–33.0)
MCHC: 34.3 g/dL (ref 31.5–35.7)
MCV: 88 fL (ref 79–97)
PLATELETS: 252 10*3/uL (ref 150–379)
RBC: 4.49 x10E6/uL (ref 3.77–5.28)
RDW: 14.5 % (ref 12.3–15.4)
WBC: 5.8 10*3/uL (ref 3.4–10.8)

## 2017-04-19 LAB — RPR: RPR: NONREACTIVE

## 2017-04-19 LAB — HIV ANTIBODY (ROUTINE TESTING W REFLEX): HIV Screen 4th Generation wRfx: NONREACTIVE

## 2017-04-21 LAB — GC/CHLAMYDIA PROBE AMP
CHLAMYDIA, DNA PROBE: NEGATIVE
Neisseria gonorrhoeae by PCR: NEGATIVE

## 2017-04-22 ENCOUNTER — Encounter: Payer: Self-pay | Admitting: Physician Assistant

## 2017-04-23 ENCOUNTER — Encounter: Payer: Self-pay | Admitting: Physician Assistant

## 2017-04-26 ENCOUNTER — Encounter: Payer: Self-pay | Admitting: Physician Assistant

## 2017-04-30 DIAGNOSIS — K660 Peritoneal adhesions (postprocedural) (postinfection): Secondary | ICD-10-CM | POA: Diagnosis not present

## 2017-04-30 DIAGNOSIS — Z803 Family history of malignant neoplasm of breast: Secondary | ICD-10-CM | POA: Diagnosis not present

## 2017-04-30 DIAGNOSIS — Z8249 Family history of ischemic heart disease and other diseases of the circulatory system: Secondary | ICD-10-CM | POA: Diagnosis not present

## 2017-04-30 DIAGNOSIS — Z885 Allergy status to narcotic agent status: Secondary | ICD-10-CM | POA: Diagnosis not present

## 2017-04-30 DIAGNOSIS — K8010 Calculus of gallbladder with chronic cholecystitis without obstruction: Secondary | ICD-10-CM | POA: Diagnosis not present

## 2017-04-30 DIAGNOSIS — Z886 Allergy status to analgesic agent status: Secondary | ICD-10-CM | POA: Diagnosis not present

## 2017-04-30 DIAGNOSIS — Z833 Family history of diabetes mellitus: Secondary | ICD-10-CM | POA: Diagnosis not present

## 2017-04-30 DIAGNOSIS — Z880 Allergy status to penicillin: Secondary | ICD-10-CM | POA: Diagnosis not present

## 2017-04-30 DIAGNOSIS — Z8042 Family history of malignant neoplasm of prostate: Secondary | ICD-10-CM | POA: Diagnosis not present

## 2017-04-30 DIAGNOSIS — Z9884 Bariatric surgery status: Secondary | ICD-10-CM | POA: Diagnosis not present

## 2017-04-30 DIAGNOSIS — K819 Cholecystitis, unspecified: Secondary | ICD-10-CM | POA: Diagnosis not present

## 2017-04-30 DIAGNOSIS — K458 Other specified abdominal hernia without obstruction or gangrene: Secondary | ICD-10-CM | POA: Diagnosis not present

## 2017-04-30 DIAGNOSIS — K210 Gastro-esophageal reflux disease with esophagitis: Secondary | ICD-10-CM | POA: Diagnosis not present

## 2017-04-30 DIAGNOSIS — K811 Chronic cholecystitis: Secondary | ICD-10-CM | POA: Diagnosis not present

## 2017-04-30 DIAGNOSIS — Z882 Allergy status to sulfonamides status: Secondary | ICD-10-CM | POA: Diagnosis not present

## 2017-04-30 DIAGNOSIS — Z79899 Other long term (current) drug therapy: Secondary | ICD-10-CM | POA: Diagnosis not present

## 2017-04-30 DIAGNOSIS — Z800 Family history of malignant neoplasm of digestive organs: Secondary | ICD-10-CM | POA: Diagnosis not present

## 2017-04-30 DIAGNOSIS — Z90710 Acquired absence of both cervix and uterus: Secondary | ICD-10-CM | POA: Diagnosis not present

## 2017-05-02 DIAGNOSIS — K219 Gastro-esophageal reflux disease without esophagitis: Secondary | ICD-10-CM | POA: Diagnosis not present

## 2017-05-02 DIAGNOSIS — D72829 Elevated white blood cell count, unspecified: Secondary | ICD-10-CM | POA: Diagnosis not present

## 2017-05-02 DIAGNOSIS — R1084 Generalized abdominal pain: Secondary | ICD-10-CM | POA: Diagnosis not present

## 2017-05-02 DIAGNOSIS — Z9109 Other allergy status, other than to drugs and biological substances: Secondary | ICD-10-CM | POA: Diagnosis not present

## 2017-05-02 DIAGNOSIS — F419 Anxiety disorder, unspecified: Secondary | ICD-10-CM | POA: Diagnosis not present

## 2017-05-02 DIAGNOSIS — Z91013 Allergy to seafood: Secondary | ICD-10-CM | POA: Diagnosis not present

## 2017-05-02 DIAGNOSIS — M1990 Unspecified osteoarthritis, unspecified site: Secondary | ICD-10-CM | POA: Diagnosis not present

## 2017-05-02 DIAGNOSIS — Z882 Allergy status to sulfonamides status: Secondary | ICD-10-CM | POA: Diagnosis not present

## 2017-05-02 DIAGNOSIS — R1031 Right lower quadrant pain: Secondary | ICD-10-CM | POA: Diagnosis not present

## 2017-05-02 DIAGNOSIS — Z880 Allergy status to penicillin: Secondary | ICD-10-CM | POA: Diagnosis not present

## 2017-05-02 DIAGNOSIS — R509 Fever, unspecified: Secondary | ICD-10-CM | POA: Diagnosis not present

## 2017-05-02 DIAGNOSIS — Z91040 Latex allergy status: Secondary | ICD-10-CM | POA: Diagnosis not present

## 2017-05-02 DIAGNOSIS — R319 Hematuria, unspecified: Secondary | ICD-10-CM | POA: Diagnosis not present

## 2017-05-02 DIAGNOSIS — Z79899 Other long term (current) drug therapy: Secondary | ICD-10-CM | POA: Diagnosis not present

## 2017-05-02 DIAGNOSIS — Z9049 Acquired absence of other specified parts of digestive tract: Secondary | ICD-10-CM | POA: Diagnosis not present

## 2017-05-02 DIAGNOSIS — K5900 Constipation, unspecified: Secondary | ICD-10-CM | POA: Diagnosis not present

## 2017-05-02 DIAGNOSIS — Z886 Allergy status to analgesic agent status: Secondary | ICD-10-CM | POA: Diagnosis not present

## 2017-05-02 DIAGNOSIS — Z885 Allergy status to narcotic agent status: Secondary | ICD-10-CM | POA: Diagnosis not present

## 2017-05-03 DIAGNOSIS — R319 Hematuria, unspecified: Secondary | ICD-10-CM | POA: Diagnosis not present

## 2017-05-03 DIAGNOSIS — D72829 Elevated white blood cell count, unspecified: Secondary | ICD-10-CM | POA: Diagnosis not present

## 2017-05-03 DIAGNOSIS — R1084 Generalized abdominal pain: Secondary | ICD-10-CM | POA: Diagnosis not present

## 2017-05-03 DIAGNOSIS — K5900 Constipation, unspecified: Secondary | ICD-10-CM | POA: Diagnosis not present

## 2017-05-03 MED ORDER — POLYETHYLENE GLYCOL 3350 17 G PO PACK
17.00 | PACK | ORAL | Status: DC
Start: 2017-05-04 — End: 2017-05-03

## 2017-05-03 MED ORDER — LACTULOSE 10 GM/15ML PO SOLN
30.00 | ORAL | Status: DC
Start: 2017-05-03 — End: 2017-05-03

## 2017-05-03 MED ORDER — ONDANSETRON HCL 4 MG/2ML IJ SOLN
4.00 | INTRAMUSCULAR | Status: DC
Start: ? — End: 2017-05-03

## 2017-05-03 MED ORDER — SIMETHICONE 80 MG PO CHEW
80.00 | CHEWABLE_TABLET | ORAL | Status: DC
Start: ? — End: 2017-05-03

## 2017-05-03 MED ORDER — LIDOCAINE HCL (PF) 1 % IJ SOLN
0.50 | INTRAMUSCULAR | Status: DC
Start: ? — End: 2017-05-03

## 2017-05-03 MED ORDER — LACTATED RINGERS IV SOLN
INTRAVENOUS | Status: DC
Start: ? — End: 2017-05-03

## 2017-05-03 MED ORDER — ACETAMINOPHEN 325 MG PO TABS
650.00 | ORAL_TABLET | ORAL | Status: DC
Start: ? — End: 2017-05-03

## 2017-05-03 MED ORDER — MORPHINE SULFATE 2 MG/ML IJ SOLN
1.00 | INTRAMUSCULAR | Status: DC
Start: ? — End: 2017-05-03

## 2017-05-03 MED ORDER — BISACODYL 10 MG RE SUPP
10.00 | RECTAL | Status: DC
Start: ? — End: 2017-05-03

## 2017-05-03 MED ORDER — SENNOSIDES-DOCUSATE SODIUM 8.6-50 MG PO TABS
2.00 | ORAL_TABLET | ORAL | Status: DC
Start: 2017-05-03 — End: 2017-05-03

## 2017-05-03 MED ORDER — PROMETHAZINE HCL 25 MG/ML IJ SOLN
12.50 | INTRAMUSCULAR | Status: DC
Start: ? — End: 2017-05-03

## 2017-05-20 DIAGNOSIS — L709 Acne, unspecified: Secondary | ICD-10-CM | POA: Diagnosis not present

## 2017-05-20 DIAGNOSIS — Z9884 Bariatric surgery status: Secondary | ICD-10-CM | POA: Diagnosis not present

## 2017-05-20 DIAGNOSIS — Z9049 Acquired absence of other specified parts of digestive tract: Secondary | ICD-10-CM | POA: Diagnosis not present

## 2017-05-20 DIAGNOSIS — Z48815 Encounter for surgical aftercare following surgery on the digestive system: Secondary | ICD-10-CM | POA: Diagnosis not present

## 2017-05-23 ENCOUNTER — Other Ambulatory Visit: Payer: Self-pay | Admitting: Physician Assistant

## 2017-05-23 DIAGNOSIS — R519 Headache, unspecified: Secondary | ICD-10-CM

## 2017-05-23 DIAGNOSIS — R51 Headache: Principal | ICD-10-CM

## 2017-05-25 ENCOUNTER — Encounter: Payer: Self-pay | Admitting: Physician Assistant

## 2017-05-25 NOTE — Telephone Encounter (Signed)
Refill Zanaflex 2 MG capsule LOV 04/18/17 [facility]

## 2017-05-29 ENCOUNTER — Other Ambulatory Visit: Payer: Self-pay | Admitting: Physician Assistant

## 2017-05-29 DIAGNOSIS — R519 Headache, unspecified: Secondary | ICD-10-CM

## 2017-05-29 DIAGNOSIS — R51 Headache: Principal | ICD-10-CM

## 2017-06-24 ENCOUNTER — Encounter: Payer: Self-pay | Admitting: Physician Assistant

## 2017-06-29 DIAGNOSIS — L709 Acne, unspecified: Secondary | ICD-10-CM | POA: Diagnosis not present

## 2017-07-11 ENCOUNTER — Other Ambulatory Visit: Payer: Self-pay | Admitting: Physician Assistant

## 2017-07-11 ENCOUNTER — Ambulatory Visit: Payer: BLUE CROSS/BLUE SHIELD | Admitting: Physician Assistant

## 2017-07-11 DIAGNOSIS — J301 Allergic rhinitis due to pollen: Secondary | ICD-10-CM

## 2017-07-11 DIAGNOSIS — F411 Generalized anxiety disorder: Secondary | ICD-10-CM

## 2017-07-11 DIAGNOSIS — M25552 Pain in left hip: Secondary | ICD-10-CM

## 2017-07-11 DIAGNOSIS — G4700 Insomnia, unspecified: Secondary | ICD-10-CM

## 2017-07-11 DIAGNOSIS — R519 Headache, unspecified: Secondary | ICD-10-CM

## 2017-07-11 DIAGNOSIS — G629 Polyneuropathy, unspecified: Secondary | ICD-10-CM

## 2017-07-11 DIAGNOSIS — J329 Chronic sinusitis, unspecified: Secondary | ICD-10-CM

## 2017-07-11 DIAGNOSIS — M25551 Pain in right hip: Secondary | ICD-10-CM

## 2017-07-11 DIAGNOSIS — J310 Chronic rhinitis: Secondary | ICD-10-CM

## 2017-07-11 DIAGNOSIS — R51 Headache: Secondary | ICD-10-CM

## 2017-07-11 MED ORDER — GABAPENTIN 300 MG PO CAPS
300.0000 mg | ORAL_CAPSULE | Freq: Every day | ORAL | 1 refills | Status: DC
Start: 2017-07-11 — End: 2017-10-07

## 2017-07-11 MED ORDER — CLONAZEPAM 1 MG PO TABS: 1.0000 mg | ORAL_TABLET | Freq: Every evening | ORAL | 2 refills | Status: DC | PRN

## 2017-07-11 MED ORDER — DICLOFENAC SODIUM 1 % TD GEL: 4.0000 g | Freq: Four times a day (QID) | TRANSDERMAL | 0 refills | Status: DC

## 2017-07-11 MED ORDER — TIZANIDINE HCL 2 MG PO CAPS: ORAL_CAPSULE | ORAL | 2 refills | Status: DC

## 2017-07-11 MED ORDER — RIZATRIPTAN BENZOATE 5 MG PO TBDP: ORAL_TABLET | ORAL | 5 refills | Status: DC

## 2017-07-11 MED ORDER — OLOPATADINE HCL 0.6 % NA SOLN: NASAL | 5 refills | Status: DC

## 2017-07-11 MED ORDER — CETIRIZINE HCL 10 MG PO TABS: 10.0000 mg | ORAL_TABLET | Freq: Every day | ORAL | 11 refills | Status: DC

## 2017-07-11 MED ORDER — FLUOXETINE HCL 20 MG PO TABS: 20.0000 mg | ORAL_TABLET | Freq: Every day | ORAL | 1 refills | Status: DC

## 2017-07-24 DIAGNOSIS — E663 Overweight: Secondary | ICD-10-CM | POA: Diagnosis not present

## 2017-07-24 DIAGNOSIS — Z48815 Encounter for surgical aftercare following surgery on the digestive system: Secondary | ICD-10-CM | POA: Diagnosis not present

## 2017-07-24 DIAGNOSIS — Z713 Dietary counseling and surveillance: Secondary | ICD-10-CM | POA: Diagnosis not present

## 2017-07-24 DIAGNOSIS — Z9884 Bariatric surgery status: Secondary | ICD-10-CM | POA: Diagnosis not present

## 2017-07-24 DIAGNOSIS — Z6827 Body mass index (BMI) 27.0-27.9, adult: Secondary | ICD-10-CM | POA: Diagnosis not present

## 2017-07-24 DIAGNOSIS — K912 Postsurgical malabsorption, not elsewhere classified: Secondary | ICD-10-CM | POA: Diagnosis not present

## 2017-07-24 DIAGNOSIS — Z9049 Acquired absence of other specified parts of digestive tract: Secondary | ICD-10-CM | POA: Diagnosis not present

## 2017-07-24 DIAGNOSIS — R197 Diarrhea, unspecified: Secondary | ICD-10-CM | POA: Diagnosis not present

## 2017-07-24 DIAGNOSIS — R198 Other specified symptoms and signs involving the digestive system and abdomen: Secondary | ICD-10-CM | POA: Diagnosis not present

## 2017-07-24 DIAGNOSIS — K5900 Constipation, unspecified: Secondary | ICD-10-CM | POA: Diagnosis not present

## 2017-09-27 ENCOUNTER — Encounter: Payer: Self-pay | Admitting: Family Medicine

## 2017-09-29 DIAGNOSIS — R152 Fecal urgency: Secondary | ICD-10-CM | POA: Diagnosis not present

## 2017-09-29 DIAGNOSIS — Z9884 Bariatric surgery status: Secondary | ICD-10-CM | POA: Diagnosis not present

## 2017-09-29 DIAGNOSIS — R5381 Other malaise: Secondary | ICD-10-CM | POA: Diagnosis not present

## 2017-09-29 DIAGNOSIS — R198 Other specified symptoms and signs involving the digestive system and abdomen: Secondary | ICD-10-CM | POA: Diagnosis not present

## 2017-09-29 DIAGNOSIS — R5382 Chronic fatigue, unspecified: Secondary | ICD-10-CM | POA: Diagnosis not present

## 2017-10-01 ENCOUNTER — Ambulatory Visit: Payer: BLUE CROSS/BLUE SHIELD | Admitting: Family Medicine

## 2017-10-07 ENCOUNTER — Encounter: Payer: Self-pay | Admitting: Family Medicine

## 2017-10-07 ENCOUNTER — Other Ambulatory Visit: Payer: Self-pay

## 2017-10-07 ENCOUNTER — Ambulatory Visit: Payer: BLUE CROSS/BLUE SHIELD | Admitting: Family Medicine

## 2017-10-07 DIAGNOSIS — M25551 Pain in right hip: Secondary | ICD-10-CM

## 2017-10-07 DIAGNOSIS — G4700 Insomnia, unspecified: Secondary | ICD-10-CM | POA: Diagnosis not present

## 2017-10-07 DIAGNOSIS — M25552 Pain in left hip: Secondary | ICD-10-CM

## 2017-10-07 DIAGNOSIS — J310 Chronic rhinitis: Secondary | ICD-10-CM

## 2017-10-07 DIAGNOSIS — G629 Polyneuropathy, unspecified: Secondary | ICD-10-CM | POA: Diagnosis not present

## 2017-10-07 DIAGNOSIS — R51 Headache: Secondary | ICD-10-CM | POA: Diagnosis not present

## 2017-10-07 DIAGNOSIS — B373 Candidiasis of vulva and vagina: Secondary | ICD-10-CM

## 2017-10-07 DIAGNOSIS — J329 Chronic sinusitis, unspecified: Secondary | ICD-10-CM

## 2017-10-07 DIAGNOSIS — B3731 Acute candidiasis of vulva and vagina: Secondary | ICD-10-CM

## 2017-10-07 DIAGNOSIS — R519 Headache, unspecified: Secondary | ICD-10-CM

## 2017-10-07 DIAGNOSIS — F411 Generalized anxiety disorder: Secondary | ICD-10-CM

## 2017-10-07 MED ORDER — OLOPATADINE HCL 0.6 % NA SOLN: NASAL | 5 refills | Status: DC

## 2017-10-07 MED ORDER — CLONAZEPAM 1 MG PO TABS: 1.0000 mg | ORAL_TABLET | Freq: Every evening | ORAL | 2 refills | Status: DC | PRN

## 2017-10-07 MED ORDER — TIZANIDINE HCL 2 MG PO CAPS: ORAL_CAPSULE | ORAL | 2 refills | Status: DC

## 2017-10-07 MED ORDER — PANTOPRAZOLE SODIUM 40 MG PO TBEC: 40.0000 mg | DELAYED_RELEASE_TABLET | Freq: Every day | ORAL | 3 refills | Status: DC

## 2017-10-07 MED ORDER — FLUOXETINE HCL 20 MG PO TABS: 20.0000 mg | ORAL_TABLET | Freq: Every day | ORAL | 1 refills | Status: DC

## 2017-10-07 MED ORDER — CLONIDINE HCL 0.1 MG PO TABS: 0.1000 mg | ORAL_TABLET | Freq: Every day | ORAL | 2 refills | Status: DC

## 2017-10-07 MED ORDER — DICLOFENAC SODIUM 1 % TD GEL: 4.0000 g | Freq: Four times a day (QID) | TRANSDERMAL | 1 refills | Status: AC

## 2017-10-07 MED ORDER — EPINEPHRINE 0.3 MG/0.3ML IJ SOAJ: 0.3000 mg | Freq: Once | INTRAMUSCULAR | 0 refills | Status: AC

## 2017-10-07 MED ORDER — FLUCONAZOLE 150 MG PO TABS: 150.0000 mg | ORAL_TABLET | ORAL | 0 refills | Status: DC

## 2017-10-07 MED ORDER — GABAPENTIN 300 MG PO CAPS: 300.0000 mg | ORAL_CAPSULE | Freq: Every day | ORAL | 1 refills | Status: DC

## 2017-10-07 NOTE — Progress Notes (Signed)
Chief Complaint  Patient presents with  . Establish Care    former English patient  . Medication Refill    cetirizine, klonopin, catapres, voltaren, diflucan, prozac, neurontin, olopatadine, protonix, zanaflex, epipen    HPI   Establish Care with a new provider and needs refills She has a history of anxiety and depression, multiple surgeries,  Neuropathy, bilateral hip pain, chronic headaches and insomnia.  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    Stress and anxiety [facility] 10/07/2017 04/18/2017 02/21/2017 01/31/2017 10/04/2016  Decreased Interest 0 0 0 3 0  Down, Depressed, Hopeless 0 0 0 1 0  PHQ - 2 Score 0 0 0 4 0  Altered sleeping - - - 3 -  Tired, decreased energy - - - 3 -  Change in appetite - - - 1 -  Feeling bad or failure about yourself  - - - 1 -  Trouble concentrating - - - 1 -  Moving slowly or fidgety/restless - - - 0 -  Suicidal thoughts - - - 0 -  PHQ-9 Score - - - 13 -  Difficult doing work/chores - - - Not difficult at all -      Past Medical History:  Diagnosis Date  . Allergy   . GERD (gastroesophageal reflux disease)     Current Outpatient Medications  Medication Sig Dispense Refill  . cetirizine (ZYRTEC) 10 MG tablet Take 1 tablet (10 mg total) by mouth daily. 30 tablet 11  . clonazePAM (KLONOPIN) 1 MG tablet Take 1 tablet (1 mg total) by mouth at bedtime as needed for anxiety. 30 tablet 2  . cloNIDine (CATAPRES) 0.1 MG tablet Take 1 tablet (0.1 mg total) by mouth daily. 60 tablet 2  . diclofenac sodium (VOLTAREN) 1 % GEL Apply 4 g topically 4 (four) times daily. 100 g 1  . fluconazole (DIFLUCAN) 150 MG tablet Take 1 tablet (150 mg total) by mouth every 3 (three) days. Repeat if needed 2 tablet 0  . FLUoxetine (PROZAC) 20 MG tablet Take 1 tablet (20 mg total) by mouth daily. Take half tablet daily for the first week, then increase to 1 full tablet daily 90 tablet 1  . gabapentin (NEURONTIN) 300 MG capsule  Take 1 capsule (300 mg total) by mouth at bedtime. 90 capsule 1  . Multiple Vitamin (MULTIVITAMIN) tablet Take 1 tablet by mouth daily.    . Olopatadine HCl 0.6 % SOLN Use 1 spray in each nostril twice daily 1 Bottle 5  . pantoprazole (PROTONIX) 40 MG tablet Take 1 tablet (40 mg total) by mouth daily. 90 tablet 3  . polyethylene glycol (MIRALAX / GLYCOLAX) packet Take 17 g by mouth daily.    . tizanidine (ZANAFLEX) 2 MG capsule TAKE 1 CAPSULE(2 MG) BY MOUTH EVERY 6 HOURS AS NEEDED FOR MUSCLE SPASMS 30 capsule 2   No current facility-administered medications for this visit.     Allergies:  Allergies  Allergen Reactions  . Latex Hives, Itching and Swelling  . Shellfish Allergy Anaphylaxis  . Aspirin   . Other Itching    Multiple fruits and certain seeds -itching and rash  . Sulfa Antibiotics Other (See Comments)    blisters  . Codeine Rash  . Penicillins Rash    Past Surgical History:  Procedure Laterality Date  . ABDOMINAL HYSTERECTOMY    . GASTRECTOMY    . TUBAL LIGATION    . TUBAL LIGATION      Social  History   Socioeconomic History  . Marital status: Single    Spouse name: Not on file  . Number of children: Not on file  . Years of education: Not on file  . Highest education level: Not on file  Occupational History  . Not on file  Social Needs  . Financial resource strain: Not on file  . Food insecurity:    Worry: Not on file    Inability: Not on file  . Transportation needs:    Medical: Not on file    Non-medical: Not on file  Tobacco Use  . Smoking status: Never Smoker  . Smokeless tobacco: Never Used  Substance and Sexual Activity  . Alcohol use: No    Alcohol/week: 0.0 oz  . Drug use: No  . Sexual activity: Not on file  Lifestyle  . Physical activity:    Days per week: Not on file    Minutes per session: Not on file  . Stress: Not on file  Relationships  . Social connections:    Talks on phone: Not on file    Gets together: Not on file     Attends religious service: Not on file    Active member of club or organization: Not on file    Attends meetings of clubs or organizations: Not on file    Relationship status: Not on file  Other Topics Concern  . Not on file  Social History Narrative  . Not on file    Family History  Problem Relation Age of Onset  . Diabetes Mother   . Hypertension Mother   . Diabetes Father   . Hypertension Father   . Cancer Sister   . Diabetes Brother   . Hypertension Brother   . Hyperlipidemia Brother      ROS Review of Systems See HPI Constitution: No fevers or chills No malaise No diaphoresis Skin: No rash or itching Eyes: no blurry vision, no double vision GU: no dysuria or hematuria Neuro: no dizziness or headaches  all others reviewed and negative   Objective: Vitals:   10/07/17 1112  BP: 106/68  Pulse: 70  Resp: 16  Temp: 99.1 F (37.3 C)  TempSrc: Oral  SpO2: 100%  Weight: 165 lb 12.8 oz (75.2 kg)  Height: 5\' 5"  (1.651 m)    Physical Exam  Constitutional: She is oriented to person, place, and time. She appears well-developed and well-nourished.  HENT:  Head: Normocephalic and atraumatic.  Eyes: Conjunctivae and EOM are normal.  Neck: Normal range of motion. Neck supple.  Cardiovascular: Normal rate, regular rhythm and normal heart sounds.  No murmur heard. Pulmonary/Chest: Effort normal and breath sounds normal. No stridor. No respiratory distress.  Neurological: She is alert and oriented to person, place, and time.  Skin: Skin is warm. Capillary refill takes less than 2 seconds.  Psychiatric: She has a normal mood and affect. Her behavior is normal. Judgment and thought content normal.    Assessment and Plan Jackie Ellis was seen today for establish care and medication refill.  Diagnoses and all orders for this visit:  Insomnia, unspecified type -     clonazePAM (KLONOPIN) 1 MG tablet; Take 1 tablet (1 mg total) by mouth at bedtime as needed for  anxiety.  Intractable headache, unspecified chronicity pattern, unspecified headache type -     tizanidine (ZANAFLEX) 2 MG capsule; TAKE 1 CAPSULE(2 MG) BY MOUTH EVERY 6 HOURS AS NEEDED FOR MUSCLE SPASMS  Yeast vaginitis -     fluconazole (DIFLUCAN)  150 MG tablet; Take 1 tablet (150 mg total) by mouth every 3 (three) days. Repeat if needed  Neuropathy -     gabapentin (NEURONTIN) 300 MG capsule; Take 1 capsule (300 mg total) by mouth at bedtime.  Pain of both hip joints -     diclofenac sodium (VOLTAREN) 1 % GEL; Apply 4 g topically 4 (four) times daily.  Rhinosinusitis -     Olopatadine HCl 0.6 % SOLN; Use 1 spray in each nostril twice daily  Anxiety state -     FLUoxetine (PROZAC) 20 MG tablet; Take 1 tablet (20 mg total) by mouth daily. Take half tablet daily for the first week, then increase to 1 full tablet daily  Other orders -     cloNIDine (CATAPRES) 0.1 MG tablet; Take 1 tablet (0.1 mg total) by mouth daily. -     pantoprazole (PROTONIX) 40 MG tablet; Take 1 tablet (40 mg total) by mouth daily.   Refilled medications Reviewed labs in Careeverywhere Discussed follow up  Discussed health screenings Pt stable on current meds  Jurnie Garritano A Creta Levin

## 2017-10-07 NOTE — Patient Instructions (Signed)
     IF you received an x-ray today, you will receive an invoice from [facility]. Please contact [facility] at [phone] with questions or concerns regarding your invoice.   IF you received labwork today, you will receive an invoice from [facility]. Please contact [facility] at [phone] with questions or concerns regarding your invoice.   Our billing staff will not be able to assist you with questions regarding bills from these companies.  You will be contacted with the lab results as soon as they are available. The fastest way to get your results is to activate your My Chart account. Instructions are located on the last page of this paperwork. If you have not heard from us regarding the results in 2 weeks, please contact this office.     

## 2017-10-08 DIAGNOSIS — Z1231 Encounter for screening mammogram for malignant neoplasm of breast: Secondary | ICD-10-CM | POA: Diagnosis not present

## 2017-10-08 DIAGNOSIS — Z803 Family history of malignant neoplasm of breast: Secondary | ICD-10-CM | POA: Diagnosis not present

## 2017-10-08 DIAGNOSIS — R922 Inconclusive mammogram: Secondary | ICD-10-CM | POA: Diagnosis not present

## 2017-10-08 DIAGNOSIS — Z1239 Encounter for other screening for malignant neoplasm of breast: Secondary | ICD-10-CM | POA: Diagnosis not present

## 2017-10-09 ENCOUNTER — Encounter: Payer: Self-pay | Admitting: Family Medicine

## 2017-10-09 DIAGNOSIS — Z1211 Encounter for screening for malignant neoplasm of colon: Secondary | ICD-10-CM

## 2017-10-22 DIAGNOSIS — D120 Benign neoplasm of cecum: Secondary | ICD-10-CM | POA: Diagnosis not present

## 2017-10-22 DIAGNOSIS — K529 Noninfective gastroenteritis and colitis, unspecified: Secondary | ICD-10-CM | POA: Diagnosis not present

## 2017-10-22 DIAGNOSIS — K6389 Other specified diseases of intestine: Secondary | ICD-10-CM | POA: Diagnosis not present

## 2017-10-22 DIAGNOSIS — R197 Diarrhea, unspecified: Secondary | ICD-10-CM | POA: Diagnosis not present

## 2017-10-22 DIAGNOSIS — Z886 Allergy status to analgesic agent status: Secondary | ICD-10-CM | POA: Diagnosis not present

## 2017-10-22 DIAGNOSIS — Z9884 Bariatric surgery status: Secondary | ICD-10-CM | POA: Diagnosis not present

## 2017-10-22 DIAGNOSIS — Z882 Allergy status to sulfonamides status: Secondary | ICD-10-CM | POA: Diagnosis not present

## 2017-10-22 DIAGNOSIS — Z880 Allergy status to penicillin: Secondary | ICD-10-CM | POA: Diagnosis not present

## 2017-10-22 DIAGNOSIS — Z885 Allergy status to narcotic agent status: Secondary | ICD-10-CM | POA: Diagnosis not present

## 2017-10-27 ENCOUNTER — Encounter: Payer: Self-pay | Admitting: Family Medicine

## 2017-10-27 ENCOUNTER — Telehealth: Payer: Self-pay | Admitting: Family Medicine

## 2017-10-27 NOTE — Telephone Encounter (Signed)
Med Denial -  Pt: Jackie Ellis DOB: Jan 01, 1966 UJW:[id number]  The Diclofenac-Sodium Gel was denied  They came back with alternative meds of Ibuprofen 400-600 MG and Meloxicam   Please advise  Thank you!

## 2017-10-27 NOTE — Telephone Encounter (Signed)
Pt has been notified by mychart of noncoverage Advised otc aspercreme with lidocaine

## 2017-11-18 ENCOUNTER — Other Ambulatory Visit: Payer: Self-pay | Admitting: Family Medicine

## 2017-11-18 DIAGNOSIS — B373 Candidiasis of vulva and vagina: Secondary | ICD-10-CM

## 2017-11-18 DIAGNOSIS — B3731 Acute candidiasis of vulva and vagina: Secondary | ICD-10-CM

## 2017-11-19 NOTE — Telephone Encounter (Signed)
Fluconazole 150 mg refill Last Refill:10/07/17  # 2 tablets Last OV: 10/07/17 PCP: Creta Levin Pharmacy:[facility] [id number]  Returned:  Not covered under [facility] protocol

## 2018-05-26 ENCOUNTER — Other Ambulatory Visit: Payer: Self-pay

## 2018-05-26 ENCOUNTER — Encounter: Payer: Self-pay | Admitting: Family Medicine

## 2018-05-26 ENCOUNTER — Ambulatory Visit: Payer: 59 | Admitting: Family Medicine

## 2018-05-26 VITALS — BP 112/72 | HR 56 | Temp 98.5°F | Resp 14 | Ht 65.0 in | Wt 164.2 lb

## 2018-05-26 DIAGNOSIS — R51 Headache: Secondary | ICD-10-CM | POA: Diagnosis not present

## 2018-05-26 DIAGNOSIS — G43809 Other migraine, not intractable, without status migrainosus: Secondary | ICD-10-CM | POA: Diagnosis not present

## 2018-05-26 DIAGNOSIS — R519 Headache, unspecified: Secondary | ICD-10-CM

## 2018-05-26 MED ORDER — RIZATRIPTAN BENZOATE 5 MG PO TBDP: ORAL_TABLET | ORAL | 2 refills | Status: DC

## 2018-05-26 MED ORDER — TIZANIDINE HCL 2 MG PO CAPS: ORAL_CAPSULE | ORAL | 2 refills | Status: DC

## 2018-05-26 MED ORDER — ONDANSETRON HCL 4 MG PO TABS: 4.0000 mg | ORAL_TABLET | Freq: Three times a day (TID) | ORAL | 0 refills | Status: DC | PRN

## 2018-05-26 NOTE — Patient Instructions (Addendum)
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  Start tizanidine, Zofran as needed for nausea.  Maxalt may be more helpful at the onset of migraine headaches but I did refill that medication as well.  Make sure you drink plenty of fluids, rest and recheck if not improving as expected.  Return to the clinic or go to the nearest emergency room if any of your symptoms worsen or new symptoms occur.   Migraine Headache A migraine headache is an intense, throbbing pain on one side or both sides of the head. Migraines may also cause other symptoms, such as nausea, vomiting, and sensitivity to light and noise. What are the causes? Doing or taking certain things may also trigger migraines, such as:  Alcohol.  Smoking.  Medicines, such as: ? Medicine used to treat chest pain (nitroglycerine). ? Birth control pills. ? Estrogen pills. ? Certain blood pressure medicines.  Aged cheeses, chocolate, or caffeine.  Foods or drinks that contain nitrates, glutamate, aspartame, or tyramine.  Physical activity. Other things that may trigger a migraine include:  Menstruation.  Pregnancy.  Hunger.  Stress, lack of sleep, too much sleep, or fatigue.  Weather changes. What increases the risk? The following factors may make you more likely to experience migraine headaches:  Age. Risk increases with age.  Family history of migraine headaches.  Being Caucasian.  Depression and anxiety.  Obesity.  Being a woman.  Having a hole in the heart (patent foramen ovale) or other heart problems. What are the signs or symptoms? The main symptom of this condition is pulsating or throbbing pain. Pain may:  Happen in any area of the head, such as on one side or both sides.  Interfere with daily activities.  Get worse with physical activity.  Get worse with exposure to bright lights or loud noises. Other symptoms may include:  Nausea.  Vomiting.  Dizziness.  General sensitivity  to bright lights, loud noises, or smells. Before you get a migraine, you may get warning signs that a migraine is developing (aura). An aura may include:  Seeing flashing lights or having blind spots.  Seeing bright spots, halos, or zigzag lines.  Having tunnel vision or blurred vision.  Having numbness or a tingling feeling.  Having trouble talking.  Having muscle weakness. How is this diagnosed? A migraine headache can be diagnosed based on:  Your symptoms.  A physical exam.  Tests, such as CT scan or MRI of the head. These imaging tests can help rule out other causes of headaches.  Taking fluid from the spine (lumbar puncture) and analyzing it (cerebrospinal fluid analysis, or CSF analysis). How is this treated? A migraine headache is usually treated with medicines that:  Relieve pain.  Relieve nausea.  Prevent migraines from coming back. Treatment may also include:  Acupuncture.  Lifestyle changes like avoiding foods that trigger migraines. Follow these instructions at home: Medicines  Take over-the-counter and prescription medicines only as told by your health care provider.  Do not drive or use heavy machinery while taking prescription pain medicine.  To prevent or treat constipation while you are taking prescription pain medicine, your health care provider may recommend that you: ? Drink enough fluid to keep your urine clear or pale yellow. ? Take over-the-counter or prescription medicines. ? Eat foods that are high in fiber, such as fresh fruits and vegetables, whole grains, and beans. ? Limit foods that are high in fat and processed sugars, such as fried and sweet foods. Lifestyle  Avoid alcohol use.  Do not use any products that contain nicotine or tobacco, such as cigarettes and e-cigarettes. If you need help quitting, ask your health care provider.  Get at least 8 hours of sleep every night.  Limit your stress. General instructions      Keep  a journal to find out what may trigger your migraine headaches. For example, write down: ? What you eat and drink. ? How much sleep you get. ? Any change to your diet or medicines.  If you have a migraine: ? Avoid things that make your symptoms worse, such as bright lights. ? It may help to lie down in a dark, quiet room. ? Do not drive or use heavy machinery. ? Ask your health care provider what activities are safe for you while you are experiencing symptoms.  Keep all follow-up visits as told by your health care provider. This is important. Contact a health care provider if:  You develop symptoms that are different or more severe than your usual migraine symptoms. Get help right away if:  Your migraine becomes severe.  You have a fever.  You have a stiff neck.  You have vision loss.  Your muscles feel weak or like you cannot control them.  You start to lose your balance often.  You develop trouble walking.  You faint. This information is not intended to replace advice given to you by your health care provider. Make sure you discuss any questions you have with your health care provider. Document Released: 03/03/2005 Document Revised: 09/21/2015 Document Reviewed: 08/20/2015 Elsevier Interactive Patient Education  [employer].    If you have lab work done today you will be contacted with your lab results within the next 2 weeks.  If you have not heard from [place] then please contact us. The fastest way to get your results is to register for My Chart.   IF you received an x-ray today, you will receive an invoice from [facility]. Please contact [facility] at [phone] with questions or concerns regarding your invoice.   IF you received labwork today, you will receive an invoice from [place]. Please contact [facility] at [phone] with questions or concerns regarding your invoice.   Our billing staff will not be able to assist you with questions  regarding bills from these companies.  You will be contacted with the lab results as soon as they are available. The fastest way to get your results is to activate your My Chart account. Instructions are located on the last page of this paperwork. If you have not heard from [place] regarding the results in 2 weeks, please contact this office.

## 2018-05-26 NOTE — Progress Notes (Signed)
Subjective:    Patient ID: Jackie Ellis   DOB: Jan 14, 1966, 53 y.o.   MRN: [id number]  HPI Jackie Ellis is a 53 y.o. female Presents today for: Chief Complaint  Patient presents with  . Migraine    migraine headache since sunday. Ran out of maxalt medication need a refill on maxalt, ondancertron, tinazidine   Hx of migraine HA, started with typical migraine started 2 nights ago.  Some neck spasm, then next day and today light sensitivity, nausea. Dry heaves only. No focal weakness, slurred speech, or facial droop. No fever. Feels typical of prior migraine - last one a year ago. Posterior HA radiating to front. Has used maxalt in past with tizanidine and zofran if needed. Eating less d/t nausea.   Tx: tylenol and coffee (other meds had expired).    Patient Active Problem List   Diagnosis Date Noted  . Abdominal pain, acute, generalized 03/05/2016  . 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 Acid reflux 10/05/2015  . Gastroesophageal reflux disease with esophagitis 09/19/2015  . Migraine 07/31/2015  . Morbid obesity (HCC) 07/31/2015  . Family history of breast cancer in sister 07/21/2015  . 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 History of removal of laparoscopic gastric banding device 07/18/2015  . Knee osteoarthritis 07/18/2015  . Metatarsal stress fracture 03/08/2012   Past Medical History:  Diagnosis Date  . Allergy   . GERD (gastroesophageal reflux disease)    Past Surgical History:  Procedure Laterality Date  . ABDOMINAL HYSTERECTOMY    . GASTRECTOMY    . TUBAL LIGATION    . TUBAL LIGATION     Allergies  Allergen Reactions  . Latex Hives, Itching and Swelling  . Shellfish Allergy Anaphylaxis  . Aspirin   . Other Itching    Multiple fruits and certain seeds -itching and rash  . Sulfa Antibiotics Other (See Comments)    blisters  . Codeine Rash  . Penicillins Rash   Prior to Admission medications   Medication Sig  Start Date End Date Taking? Authorizing Provider  cetirizine (ZYRTEC) 10 MG tablet Take 1 tablet (10 mg total) by mouth daily. 07/11/17  Yes English, Judeth Cornfield D, PA  clonazePAM (KLONOPIN) 1 MG tablet Take 1 tablet (1 mg total) by mouth at bedtime as needed for anxiety. 10/07/17  Yes Stallings, Zoe A, MD  cloNIDine (CATAPRES) 0.1 MG tablet Take 1 tablet (0.1 mg total) by mouth daily. 10/07/17  Yes Stallings, Zoe A, MD  diclofenac sodium (VOLTAREN) 1 % GEL Apply 4 g topically 4 (four) times daily. 10/07/17  Yes Collie Siad A, MD  fluconazole (DIFLUCAN) 150 MG tablet Take 1 tablet (150 mg total) by mouth every 3 (three) days. Repeat if needed 10/07/17  Yes Stallings, Zoe A, MD  FLUoxetine (PROZAC) 20 MG tablet Take 1 tablet (20 mg total) by mouth daily. Take half tablet daily for the first week, then increase to 1 full tablet daily 10/07/17  Yes Stallings, Zoe A, MD  gabapentin (NEURONTIN) 300 MG capsule Take 1 capsule (300 mg total) by mouth at bedtime. 10/07/17  Yes Doristine Bosworth, MD  Multiple Vitamin (MULTIVITAMIN) tablet Take 1 tablet by mouth daily.   Yes [provider]  Olopatadine HCl 0.6 % SOLN Use 1 spray in each nostril twice daily 10/07/17  Yes Stallings, Zoe A, MD  ondansetron (ZOFRAN) 4 MG tablet Take 4 mg by mouth every 8 (eight) hours as needed for nausea or vomiting.   Yes  [provider]  pantoprazole (PROTONIX) 40 MG tablet Take 1 tablet (40 mg total) by mouth daily. 10/07/17  Yes Stallings, Zoe A, MD  polyethylene glycol (MIRALAX / GLYCOLAX) packet Take 17 g by mouth daily.   Yes [provider]  tizanidine (ZANAFLEX) 2 MG capsule TAKE 1 CAPSULE(2 MG) BY MOUTH EVERY 6 HOURS AS NEEDED FOR MUSCLE SPASMS 10/07/17  Yes Doristine Bosworth, MD   Social History   Socioeconomic History  . Marital status: Single    Spouse name: Not on file  . Number of children: Not on file  . Years of education: Not on file  . Highest education level: Not on file  Occupational  History  . Not on file  Social Needs  . Financial resource strain: Not on file  . Food insecurity:    Worry: Not on file    Inability: Not on file  . Transportation needs:    Medical: Not on file    Non-medical: Not on file  Tobacco Use  . Smoking status: Never Smoker  . Smokeless tobacco: Never Used  Substance and Sexual Activity  . Alcohol use: No    Alcohol/week: 0.0 standard drinks  . Drug use: No  . Sexual activity: Not on file  Lifestyle  . Physical activity:    Days per week: Not on file    Minutes per session: Not on file  . Stress: Not on file  Relationships  . Social connections:    Talks on phone: Not on file    Gets together: Not on file    Attends religious service: Not on file    Active member of club or organization: Not on file    Attends meetings of clubs or organizations: Not on file    Relationship status: Not on file  . Intimate partner violence:    Fear of current or ex partner: Not on file    Emotionally abused: Not on file    Physically abused: Not on file    Forced sexual activity: Not on file  Other Topics Concern  . Not on file  Social History Narrative  . Not on file    Review of Systems Per HPI.     Objective:   Physical Exam Constitutional:      General: She is not in acute distress.    Appearance: She is well-developed.  HENT:     Head: Normocephalic and atraumatic.  Eyes:     Extraocular Movements: Extraocular movements intact.     Conjunctiva/sclera: Conjunctivae normal.     Pupils: Pupils are equal, round, and reactive to light.     Comments: No nystagmus.   Cardiovascular:     Rate and Rhythm: Normal rate.  Pulmonary:     Effort: Pulmonary effort is normal.  Musculoskeletal:     Cervical back: She exhibits tenderness (paraspinals. ) and spasm. She exhibits no bony tenderness.  Neurological:     General: No focal deficit present.     Mental Status: She is alert and oriented to person, place, and time.     Cranial  Nerves: No cranial nerve deficit.     Sensory: No sensory deficit.     Motor: No weakness.     Coordination: Coordination normal.     Gait: Gait normal.     Comments: No focal deficit.     Vitals:   05/26/18 0928  BP: 112/72  Pulse: (!) 56  Resp: 14  Temp: 98.5 F (36.9 C)  TempSrc:  Oral  SpO2: 100%  Weight: 164 lb 3.2 oz (74.5 kg)  Height: 5\' 5"  (1.651 m)       Assessment & Plan:   Jackie Ellis is a 53 y.o. female Other migraine without status migrainosus, not intractable - Plan: rizatriptan (MAXALT-MLT) 5 MG disintegrating tablet, ondansetron (ZOFRAN) 4 MG tablet, tizanidine (ZANAFLEX) 2 MG capsule  Intractable headache, unspecified chronicity pattern, unspecified headache type - Plan: tizanidine (ZANAFLEX) 2 MG capsule  -Typical migraine symptoms, nonfocal neurologic exam.  -Zofran for nausea, tizanidine for spasms as may have component of tension headache.  Maxalt prescribed, but less likely effective at this point for current headache, use for future headaches discussed.  -Rest, RTC/ER precautions discussed.  Meds ordered this encounter  Medications  . rizatriptan (MAXALT-MLT) 5 MG disintegrating tablet    Sig: DISSOLVE ONE TABLET BY MOUTH BY MOUTH AS NEEDED FOR MIGRAINE. MAY REPEAT IN 2 HOURS AS NEEDED. MAXIMUM OF 6 TABLETS IN 24 HOURS    Dispense:  10 tablet    Refill:  2  . ondansetron (ZOFRAN) 4 MG tablet    Sig: Take 1 tablet (4 mg total) by mouth every 8 (eight) hours as needed for nausea or vomiting.    Dispense:  20 tablet    Refill:  0  . 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  Start tizanidine, Zofran as needed for nausea.  Maxalt may be more helpful at the onset of migraine headaches but I did refill that medication as well.  Make sure you drink plenty  of fluids, rest and recheck if not improving as expected.  Return to the clinic or go to the nearest emergency room if any of your symptoms worsen or new symptoms occur.   Migraine Headache A migraine headache is an intense, throbbing pain on one side or both sides of the head. Migraines may also cause other symptoms, such as nausea, vomiting, and sensitivity to light and noise. What are the causes? Doing or taking certain things may also trigger migraines, such as:  Alcohol.  Smoking.  Medicines, such as: ? Medicine used to treat chest pain (nitroglycerine). ? Birth control pills. ? Estrogen pills. ? Certain blood pressure medicines.  Aged cheeses, chocolate, or caffeine.  Foods or drinks that contain nitrates, glutamate, aspartame, or tyramine.  Physical activity. Other things that may trigger a migraine include:  Menstruation.  Pregnancy.  Hunger.  Stress, lack of sleep, too much sleep, or fatigue.  Weather changes. What increases the risk? The following factors may make you more likely to experience migraine headaches:  Age. Risk increases with age.  Family history of migraine headaches.  Being Caucasian.  Depression and anxiety.  Obesity.  Being a woman.  Having a hole in the heart (patent foramen ovale) or other heart problems. What are the signs or symptoms? The main symptom of this condition is pulsating or throbbing pain. Pain may:  Happen in any area of the head, such as on one side or both sides.  Interfere with daily activities.  Get worse with physical activity.  Get worse with exposure to bright lights or loud noises. Other symptoms may include:  Nausea.  Vomiting.  Dizziness.  General sensitivity to bright lights, loud noises, or  smells. Before you get a migraine, you may get warning signs that a migraine is developing (aura). An aura may include:  Seeing flashing lights or having blind spots.  Seeing bright spots, halos, or  zigzag lines.  Having tunnel vision or blurred vision.  Having numbness or a tingling feeling.  Having trouble talking.  Having muscle weakness. How is this diagnosed? A migraine headache can be diagnosed based on:  Your symptoms.  A physical exam.  Tests, such as CT scan or MRI of the head. These imaging tests can help rule out other causes of headaches.  Taking fluid from the spine (lumbar puncture) and analyzing it (cerebrospinal fluid analysis, or CSF analysis). How is this treated? A migraine headache is usually treated with medicines that:  Relieve pain.  Relieve nausea.  Prevent migraines from coming back. Treatment may also include:  Acupuncture.  Lifestyle changes like avoiding foods that trigger migraines. Follow these instructions at home: Medicines  Take over-the-counter and prescription medicines only as told by your health care provider.  Do not drive or use heavy machinery while taking prescription pain medicine.  To prevent or treat constipation while you are taking prescription pain medicine, your health care provider may recommend that you: ? Drink enough fluid to keep your urine clear or pale yellow. ? Take over-the-counter or prescription medicines. ? Eat foods that are high in fiber, such as fresh fruits and vegetables, whole grains, and beans. ? Limit foods that are high in fat and processed sugars, such as fried and sweet foods. Lifestyle  Avoid alcohol use.  Do not use any products that contain nicotine or tobacco, such as cigarettes and e-cigarettes. If you need help quitting, ask your health care provider.  Get at least 8 hours of sleep every night.  Limit your stress. General instructions      Keep a journal to find out what may trigger your migraine headaches. For example, write down: ? What you eat and drink. ? How much sleep you get. ? Any change to your diet or medicines.  If you have a migraine: ? Avoid things that make  your symptoms worse, such as bright lights. ? It may help to lie down in a dark, quiet room. ? Do not drive or use heavy machinery. ? Ask your health care provider what activities are safe for you while you are experiencing symptoms.  Keep all follow-up visits as told by your health care provider. This is important. Contact a health care provider if:  You develop symptoms that are different or more severe than your usual migraine symptoms. Get help right away if:  Your migraine becomes severe.  You have a fever.  You have a stiff neck.  You have vision loss.  Your muscles feel weak or like you cannot control them.  You start to lose your balance often.  You develop trouble walking.  You faint. This information is not intended to replace advice given to you by your health care provider. Make sure you discuss any questions you have with your health care provider. Document Released: 03/03/2005 Document Revised: 09/21/2015 Document Reviewed: 08/20/2015 Elsevier Interactive Patient Education  [employer].    If you have lab work done today you will be contacted with your lab results within the next 2 weeks.  If you have not heard from [place] then please contact us. The fastest way to get your results is to register for My Chart.   IF you received an x-ray today,  you will receive an invoice from [facility]. Please contact [facility] at [phone] with questions or concerns regarding your invoice.   IF you received labwork today, you will receive an invoice from [facility]. Please contact [facility] at [phone] with questions or concerns regarding your invoice.   Our billing staff will not be able to assist you with questions regarding bills from these companies.  You will be contacted with the lab results as soon as they are available. The fastest way to get your results is to activate your My Chart account. Instructions are located on the last page of this  paperwork. If you have not heard from us regarding the results in 2 weeks, please contact this office.       Signed,   Meredith StaggersJeffrey Coren Sagan, MD [facility].  05/27/18 3:57 PM

## 2018-05-27 ENCOUNTER — Telehealth: Payer: Self-pay | Admitting: Family Medicine

## 2018-05-27 ENCOUNTER — Encounter: Payer: Self-pay | Admitting: Family Medicine

## 2018-05-27 NOTE — Telephone Encounter (Signed)
Pt called back wanting to know if work note can be extended until Monday.She was seen for headaches and still is having pain and light sensitivity.She states she does not want to lose he job and needs to know TODAY

## 2018-05-27 NOTE — Telephone Encounter (Signed)
Copied from CRM [id number]. Topic: General - Other >> May 27, 2018  2:49 PM Jackie Ellis wrote: Reason for CRM: Pt was seen in office yesterday for migraine.She states she is feeling better but still some pain and light sensitivity.She wants note extended to go back to work on Monday 05/31/18

## 2018-05-28 ENCOUNTER — Encounter: Payer: Self-pay | Admitting: Family Medicine

## 2018-05-28 NOTE — Telephone Encounter (Signed)
Pt saw Dr Chilton Si on Wed, and really needs that work note asap.  Pt would like to know if you can put the note in Mychart please. Pt states she was out of work Tues- Friday.  Was unable to get appt Tues.  CB# :  [phone]

## 2018-05-28 NOTE — Telephone Encounter (Signed)
Pt called back in to be advised.    Please assist  CB: [phone]

## 2018-05-29 ENCOUNTER — Telehealth: Payer: Self-pay | Admitting: Family Medicine

## 2018-05-29 NOTE — Telephone Encounter (Signed)
Letter sent.

## 2018-05-29 NOTE — Telephone Encounter (Signed)
Patient called she is needing a work excuse for Wednesday Thursday and Friday due to her Migraine  She would like the Excuse to be uploaded into MyChart , she needs this excuse as soon as possible  [phone] patient would like a phone call when this is done

## 2018-06-01 NOTE — Telephone Encounter (Signed)
See last note. Letter completed 3 days ago. I printed it again to fax if needed.

## 2018-06-01 NOTE — Telephone Encounter (Signed)
Please advise if note can be approved.

## 2018-09-01 DIAGNOSIS — Z6828 Body mass index (BMI) 28.0-28.9, adult: Secondary | ICD-10-CM | POA: Diagnosis not present

## 2018-09-01 DIAGNOSIS — Z48815 Encounter for surgical aftercare following surgery on the digestive system: Secondary | ICD-10-CM | POA: Diagnosis not present

## 2018-09-01 DIAGNOSIS — Z9884 Bariatric surgery status: Secondary | ICD-10-CM | POA: Diagnosis not present

## 2018-09-01 DIAGNOSIS — E669 Obesity, unspecified: Secondary | ICD-10-CM | POA: Diagnosis not present

## 2018-09-01 DIAGNOSIS — M6208 Separation of muscle (nontraumatic), other site: Secondary | ICD-10-CM | POA: Diagnosis not present

## 2018-09-01 DIAGNOSIS — K429 Umbilical hernia without obstruction or gangrene: Secondary | ICD-10-CM | POA: Diagnosis not present

## 2018-09-01 DIAGNOSIS — K912 Postsurgical malabsorption, not elsewhere classified: Secondary | ICD-10-CM | POA: Diagnosis not present

## 2018-09-01 DIAGNOSIS — Z713 Dietary counseling and surveillance: Secondary | ICD-10-CM | POA: Diagnosis not present

## 2018-09-03 ENCOUNTER — Other Ambulatory Visit: Payer: Self-pay

## 2018-09-03 ENCOUNTER — Encounter: Payer: Self-pay | Admitting: Family Medicine

## 2018-09-03 ENCOUNTER — Ambulatory Visit (INDEPENDENT_AMBULATORY_CARE_PROVIDER_SITE_OTHER): Payer: BLUE CROSS/BLUE SHIELD | Admitting: Family Medicine

## 2018-09-03 ENCOUNTER — Telehealth: Payer: Self-pay | Admitting: Family Medicine

## 2018-09-03 VITALS — BP 112/74 | HR 72 | Temp 98.4°F | Resp 17 | Ht 65.0 in | Wt 175.0 lb

## 2018-09-03 DIAGNOSIS — R42 Dizziness and giddiness: Secondary | ICD-10-CM

## 2018-09-03 DIAGNOSIS — J301 Allergic rhinitis due to pollen: Secondary | ICD-10-CM | POA: Diagnosis not present

## 2018-09-03 MED ORDER — CETIRIZINE HCL 10 MG PO TABS: 10.0000 mg | ORAL_TABLET | Freq: Every day | ORAL | 11 refills | Status: AC

## 2018-09-03 MED ORDER — MECLIZINE HCL 25 MG PO TABS: 25.0000 mg | ORAL_TABLET | Freq: Three times a day (TID) | ORAL | 0 refills | Status: DC | PRN

## 2018-09-03 NOTE — Telephone Encounter (Signed)
Copied from [place] [id number]. Topic: General - Other >> Sep 03, 2018 12:25 PM Rainey Pines A wrote: Patient would like a work note and forgot to ask during appt today. Patient would like note put in [place] and a callback.

## 2018-09-03 NOTE — Patient Instructions (Addendum)
If you have lab work done today you will be contacted with your lab results within the next 2 weeks.  If you have not heard from us then please contact us. The fastest way to get your results is to register for My Chart.   IF you received an x-ray today, you will receive an invoice from [facility]. Please contact [facility] at [phone] with questions or concerns regarding your invoice.   IF you received labwork today, you will receive an invoice from [facility]. Please contact [facility] at [phone] with questions or concerns regarding your invoice.   Our billing staff will not be able to assist you with questions regarding bills from these companies.  You will be contacted with the lab results as soon as they are available. The fastest way to get your results is to activate your My Chart account. Instructions are located on the last page of this paperwork. If you have not heard from us regarding the results in 2 weeks, please contact this office.    Vertigo Vertigo is the feeling that you or your surroundings are moving when they are not. Vertigo can be dangerous if it occurs while you are doing something that could endanger you or others, such as driving. What are the causes? This condition is caused by a disturbance in the signals that are sent by your body's sensory systems to your brain. Different causes of a disturbance can lead to vertigo, including:  Infections, especially in the inner ear.  A bad reaction to a drug, or misuse of alcohol and medicines.  Withdrawal from drugs or alcohol.  Quickly changing positions, as when lying down or rolling over in bed.  Migraine headaches.  Decreased blood flow to the brain.  Decreased blood pressure.  Increased pressure in the brain from a head or neck injury, stroke, infection, tumor, or bleeding.  Central nervous system disorders. What are the signs or symptoms? Symptoms of this condition usually  occur when you move your head or your eyes in different directions. Symptoms may start suddenly, and they usually last for less than a minute. Symptoms may include:  Loss of balance and falling.  Feeling like you are spinning or moving.  Feeling like your surroundings are spinning or moving.  Nausea and vomiting.  Blurred vision or double vision.  Difficulty hearing.  Slurred speech.  Dizziness.  Involuntary eye movement (nystagmus). Symptoms can be mild and cause only slight annoyance, or they can be severe and interfere with daily life. Episodes of vertigo may return (recur) over time, and they are often triggered by certain movements. Symptoms may improve over time. How is this diagnosed? This condition may be diagnosed based on medical history and the quality of your nystagmus. Your health care provider may test your eye movements by asking you to quickly change positions to trigger the nystagmus. This may be called the Dix-Hallpike test, head thrust test, or roll test. You may be referred to a health care provider who specializes in ear, nose, and throat (ENT) problems (otolaryngologist) or a provider who specializes in disorders of the central nervous system (neurologist). You may have additional testing, including:  A physical exam.  Blood tests.  MRI.  A CT scan.  An electrocardiogram (ECG). This records electrical activity in your heart.  An electroencephalogram (EEG). This records electrical activity in your brain.  Hearing tests. How is this treated? Treatment for this condition depends on the cause and the severity of the symptoms.  Treatment options include:  Medicines to treat nausea or vertigo. These are usually used for severe cases. Some medicines that are used to treat other conditions may also reduce or eliminate vertigo symptoms. These include: ? Medicines that control allergies (antihistamines). ? Medicines that control seizures  (anticonvulsants). ? Medicines that relieve depression (antidepressants). ? Medicines that relieve anxiety (sedatives).  Head movements to adjust your inner ear back to normal. If your vertigo is caused by an ear problem, your health care provider may recommend certain movements to correct the problem.  Surgery. This is rare. Follow these instructions at home: Safety  Move slowly.Avoid sudden body or head movements.  Avoid driving.  Avoid operating heavy machinery.  Avoid doing any tasks that would cause danger to you or others if you would have a vertigo episode during the task.  If you have trouble walking or keeping your balance, try using a cane for stability. If you feel dizzy or unstable, sit down right away.  Return to your normal activities as told by your health care provider. Ask your health care provider what activities are safe for you. General instructions  Take over-the-counter and prescription medicines only as told by your health care provider.  Avoid certain positions or movements as told by your health care provider.  Drink enough fluid to keep your urine clear or pale yellow.  Keep all follow-up visits as told by your health care provider. This is important. Contact a health care provider if:  Your medicines do not relieve your vertigo or they make it worse.  You have a fever.  Your condition gets worse or you develop new symptoms.  Your family or friends notice any behavioral changes.  Your nausea or vomiting gets worse.  You have numbness or a "pins and needles" sensation in part of your body. Get help right away if:  You have difficulty moving or speaking.  You are always dizzy.  You faint.  You develop severe headaches.  You have weakness in your hands, arms, or legs.  You have changes in your hearing or vision.  You develop a stiff neck.  You develop sensitivity to light. This information is not intended to replace advice given to  you by your health care provider. Make sure you discuss any questions you have with your health care provider. Document Released: 12/11/2004 Document Revised: 08/15/2015 Document Reviewed: 06/26/2014 Elsevier Interactive Patient Education  2019 [employer].

## 2018-09-03 NOTE — Progress Notes (Signed)
Established Patient Office Visit  Subjective:  Patient ID: Jackie Ellis, female    DOB: February 01, 1966  Age: 53 y.o. MRN: [id number]  CC:  Chief Complaint  Patient presents with  . equilibrium is off per pt , hx of vertigo and pt says she fe    x 3 months    HPI Jackie Ellis presents for vertigo She has history of vertigo She moves slowly because quick movements make her nauseous She states that she is taking her time for her balance No trauma No tinnitus She occasionally has a sensation of water pulsing in her left ear She denies double vision  She has blurry vision but is getting an eye exam in 2 weeks  She denies dehydration  She denies new meds  She is not taking anything for her dizziness She is using otc sudafed and benadryl  She was going to [facility]NT [place] for sinus issues and vertigo She states that she had dix hallpike maneuvers     Past Medical History:  Diagnosis Date  . Allergy   . GERD (gastroesophageal reflux disease)     Past Surgical History:  Procedure Laterality Date  . ABDOMINAL HYSTERECTOMY    . GASTRECTOMY    . TUBAL LIGATION    . TUBAL LIGATION      Family History  Problem Relation Age of Onset  . Diabetes Mother   . Hypertension Mother   . Diabetes Father   . Hypertension Father   . Cancer Sister   . Diabetes Brother   . Hypertension Brother   . Hyperlipidemia Brother     Social History   Socioeconomic History  . Marital status: Single    Spouse name: Not on file  . Number of children: Not on file  . Years of education: Not on file  . Highest education level: Not on file  Occupational History  . Not on file  Social Needs  . Financial resource strain: Not on file  . Food insecurity    Worry: Not on file    Inability: Not on file  . Transportation needs    Medical: Not on file    Non-medical: Not on file  Tobacco Use  . Smoking status: Never Smoker  . Smokeless tobacco: Never Used  Substance and Sexual Activity  .  Alcohol use: No    Alcohol/week: 0.0 standard drinks  . Drug use: No  . Sexual activity: Not on file  Lifestyle  . Physical activity    Days per week: Not on file    Minutes per session: Not on file  . Stress: Not on file  Relationships  . Social Herbalist on phone: Not on file    Gets together: Not on file    Attends religious service: Not on file    Active member of club or organization: Not on file    Attends meetings of clubs or organizations: Not on file    Relationship status: Not on file  . Intimate partner violence    Fear of current or ex partner: Not on file    Emotionally abused: Not on file    Physically abused: Not on file    Forced sexual activity: Not on file  Other Topics Concern  . Not on file  Social History Narrative  . Not on file    Outpatient Medications Prior to Visit  Medication Sig Dispense Refill  . cetirizine (ZYRTEC) 10 MG tablet Take 1 tablet (10 mg total) by  mouth daily. 30 tablet 11  . clonazePAM (KLONOPIN) 1 MG tablet Take 1 tablet (1 mg total) by mouth at bedtime as needed for anxiety. 30 tablet 2  . diclofenac sodium (VOLTAREN) 1 % GEL Apply 4 g topically 4 (four) times daily. 100 g 1  . FLUoxetine (PROZAC) 20 MG tablet Take 1 tablet (20 mg total) by mouth daily. Take half tablet daily for the first week, then increase to 1 full tablet daily 90 tablet 1  . gabapentin (NEURONTIN) 300 MG capsule Take 1 capsule (300 mg total) by mouth at bedtime. 90 capsule 1  . Multiple Vitamin (MULTIVITAMIN) tablet Take 1 tablet by mouth daily.    . ondansetron (ZOFRAN) 4 MG tablet Take 1 tablet (4 mg total) by mouth every 8 (eight) hours as needed for nausea or vomiting. 20 tablet 0  . pantoprazole (PROTONIX) 40 MG tablet Take 1 tablet (40 mg total) by mouth daily. 90 tablet 3  . polyethylene glycol (MIRALAX / GLYCOLAX) packet Take 17 g by mouth daily.    . rizatriptan (MAXALT-MLT) 5 MG disintegrating tablet DISSOLVE ONE TABLET BY MOUTH BY MOUTH AS  NEEDED FOR MIGRAINE. MAY REPEAT IN 2 HOURS AS NEEDED. MAXIMUM OF 6 TABLETS IN 24 HOURS 10 tablet 2  . tizanidine (ZANAFLEX) 2 MG capsule TAKE 1 CAPSULE(2 MG) BY MOUTH EVERY 6 HOURS AS NEEDED FOR MUSCLE SPASMS 30 capsule 2  . cloNIDine (CATAPRES) 0.1 MG tablet Take 1 tablet (0.1 mg total) by mouth daily. (Patient not taking: Reported on 09/03/2018) 60 tablet 2  . fluconazole (DIFLUCAN) 150 MG tablet Take 1 tablet (150 mg total) by mouth every 3 (three) days. Repeat if needed (Patient not taking: Reported on 09/03/2018) 2 tablet 0  . Olopatadine HCl 0.6 % SOLN Use 1 spray in each nostril twice daily (Patient not taking: Reported on 09/03/2018) 1 Bottle 5   No facility-administered medications prior to visit.     Allergies  Allergen Reactions  . Latex Hives, Itching and Swelling  . Shellfish Allergy Anaphylaxis  . Aspirin   . Nitrofurantoin     REACTION: rash on face  . Other Itching    Multiple fruits and certain seeds -itching and rash  . Penicillins   . Sulfa Antibiotics Other (See Comments)    blisters  . Sulfonamide Derivatives   . Codeine Rash  . Penicillins Rash    ROS Review of Systems    Objective:    Physical Exam  BP 112/74 (BP Location: Right Arm, Patient Position: Sitting, Cuff Size: Normal)   Pulse 72   Temp 98.4 F (36.9 C) (Oral)   Resp 17   Ht 5\' 5"  (1.651 m)   Wt 175 lb (79.4 kg)   SpO2 100%   BMI 29.12 kg/m  Wt Readings from Last 3 Encounters:  09/03/18 175 lb (79.4 kg)  05/26/18 164 lb 3.2 oz (74.5 kg)  10/07/17 165 lb 12.8 oz (75.2 kg)   Physical Exam  Constitutional: Oriented to person, place, and time. Appears well-developed and well-nourished.  HENT:  Head: Normocephalic and atraumatic.  Eyes: Conjunctivae and EOM are normal.  Cardiovascular: Normal rate, regular rhythm, normal heart sounds and intact distal pulses.  No murmur heard. Pulmonary/Chest: Effort normal and breath sounds normal. No stridor. No respiratory distress. Has no wheezes.   Neurological: Is alert and oriented to person, place, and time.  Skin: Skin is warm. Capillary refill takes less than 2 seconds.  Psychiatric: Has a normal mood and affect. Behavior is normal.  Judgment and thought content normal.   CRANIAL NERVES: CN II: Visual fields are full to confrontation. Fundoscopic exam is normal with sharp discs and no vascular changes. Pupils are round equal and briskly reactive to light. CN III, IV, VI: extraocular movement are normal. No ptosis. CN V: Facial sensation is intact to pinprick in all 3 divisions bilaterally. Corneal responses are intact.  CN VII: Face is symmetric with normal eye closure and smile. CN VIII: Hearing is normal to rubbing fingers CN IX, X: Palate elevates symmetrically. Phonation is normal. CN XI: Head turning and shoulder shrug are intact CN XII: Tongue is midline with normal movements and no atrophy.   Health Maintenance Due  Topic Date Due  . MAMMOGRAM  11/05/2015  . COLONOSCOPY  11/05/2015    There are no preventive care reminders to display for this patient.  Lab Results  Component Value Date   TSH 1.460 02/25/2008   Lab Results  Component Value Date   WBC 5.8 04/18/2017   HGB 13.6 04/18/2017   HCT 39.6 04/18/2017   MCV 88 04/18/2017   PLT 252 04/18/2017   Lab Results  Component Value Date   NA 140 01/31/2017   K 4.0 01/31/2017   CO2 25 01/31/2017   GLUCOSE 83 01/31/2017   BUN 10 01/31/2017   CREATININE 0.71 01/31/2017   BILITOT 0.4 01/31/2017   ALKPHOS 76 01/31/2017   AST 33 01/31/2017   ALT 47 (H) 01/31/2017   PROT 6.7 01/31/2017   ALBUMIN 4.1 01/31/2017   CALCIUM 9.4 01/31/2017   ANIONGAP 7 06/04/2015   Lab Results  Component Value Date   CHOL 164 02/25/2008   Lab Results  Component Value Date   HDL 52 02/25/2008   Lab Results  Component Value Date   LDLCALC 101 (H) 02/25/2008   Lab Results  Component Value Date   TRIG 53 02/25/2008   Lab Results  Component Value Date   CHOLHDL 3.2  Ratio 02/25/2008   No results found for: HGBA1C    Assessment & Plan:   Problem List Items Addressed This Visit    None    Visit Diagnoses    Vertigo    -  Primary   Relevant Medications   meclizine (ANTIVERT) 25 MG tablet   Other Relevant Orders   Ambulatory referral to ENT     Will refer to ENT for continuity Advised fall precautions Trial of antivert  Meds ordered this encounter  Medications  . meclizine (ANTIVERT) 25 MG tablet    Sig: Take 1 tablet (25 mg total) by mouth 3 (three) times daily as needed for dizziness.    Dispense:  30 tablet    Refill:  0    Follow-up: No follow-ups on file.   A total of 25 minutes were spent face-to-face with the patient during this encounter and over half of that time was spent on counseling and coordination of care.  Doristine BosworthZoe A , MD

## 2018-09-03 NOTE — Telephone Encounter (Signed)
Letter sent via My-chart. I have called pt to let her know.

## 2018-09-12 DIAGNOSIS — K429 Umbilical hernia without obstruction or gangrene: Secondary | ICD-10-CM | POA: Diagnosis not present

## 2018-09-12 DIAGNOSIS — Z9884 Bariatric surgery status: Secondary | ICD-10-CM | POA: Diagnosis not present

## 2018-09-13 DIAGNOSIS — L709 Acne, unspecified: Secondary | ICD-10-CM | POA: Diagnosis not present

## 2018-09-13 DIAGNOSIS — Z5181 Encounter for therapeutic drug level monitoring: Secondary | ICD-10-CM | POA: Diagnosis not present

## 2018-09-13 DIAGNOSIS — Z79899 Other long term (current) drug therapy: Secondary | ICD-10-CM | POA: Diagnosis not present

## 2018-09-13 DIAGNOSIS — L700 Acne vulgaris: Secondary | ICD-10-CM | POA: Diagnosis not present

## 2018-09-16 DIAGNOSIS — Z9884 Bariatric surgery status: Secondary | ICD-10-CM | POA: Diagnosis not present

## 2018-09-16 DIAGNOSIS — N951 Menopausal and female climacteric states: Secondary | ICD-10-CM | POA: Diagnosis not present

## 2018-09-16 DIAGNOSIS — M722 Plantar fascial fibromatosis: Secondary | ICD-10-CM | POA: Diagnosis not present

## 2018-09-16 DIAGNOSIS — Z01419 Encounter for gynecological examination (general) (routine) without abnormal findings: Secondary | ICD-10-CM | POA: Diagnosis not present

## 2018-09-16 DIAGNOSIS — Z113 Encounter for screening for infections with a predominantly sexual mode of transmission: Secondary | ICD-10-CM | POA: Diagnosis not present

## 2018-10-04 DIAGNOSIS — Z9884 Bariatric surgery status: Secondary | ICD-10-CM | POA: Diagnosis not present

## 2018-10-04 DIAGNOSIS — K429 Umbilical hernia without obstruction or gangrene: Secondary | ICD-10-CM | POA: Insufficient documentation

## 2018-10-05 DIAGNOSIS — R922 Inconclusive mammogram: Secondary | ICD-10-CM | POA: Diagnosis not present

## 2018-10-05 DIAGNOSIS — Z803 Family history of malignant neoplasm of breast: Secondary | ICD-10-CM | POA: Diagnosis not present

## 2018-10-05 DIAGNOSIS — Z1239 Encounter for other screening for malignant neoplasm of breast: Secondary | ICD-10-CM | POA: Diagnosis not present

## 2018-10-06 DIAGNOSIS — Z9884 Bariatric surgery status: Secondary | ICD-10-CM | POA: Diagnosis not present

## 2018-10-06 DIAGNOSIS — Z713 Dietary counseling and surveillance: Secondary | ICD-10-CM | POA: Diagnosis not present

## 2018-10-07 DIAGNOSIS — N644 Mastodynia: Secondary | ICD-10-CM | POA: Diagnosis not present

## 2018-10-07 DIAGNOSIS — Z1239 Encounter for other screening for malignant neoplasm of breast: Secondary | ICD-10-CM | POA: Diagnosis not present

## 2018-10-07 DIAGNOSIS — R922 Inconclusive mammogram: Secondary | ICD-10-CM | POA: Diagnosis not present

## 2018-10-07 DIAGNOSIS — Z803 Family history of malignant neoplasm of breast: Secondary | ICD-10-CM | POA: Diagnosis not present

## 2018-10-07 DIAGNOSIS — Z1231 Encounter for screening mammogram for malignant neoplasm of breast: Secondary | ICD-10-CM | POA: Diagnosis not present

## 2018-11-08 DIAGNOSIS — Z9884 Bariatric surgery status: Secondary | ICD-10-CM | POA: Diagnosis not present

## 2018-11-08 DIAGNOSIS — Z1159 Encounter for screening for other viral diseases: Secondary | ICD-10-CM | POA: Diagnosis not present

## 2018-11-08 DIAGNOSIS — Z01812 Encounter for preprocedural laboratory examination: Secondary | ICD-10-CM | POA: Diagnosis not present

## 2018-11-08 DIAGNOSIS — K5900 Constipation, unspecified: Secondary | ICD-10-CM | POA: Diagnosis not present

## 2018-11-08 DIAGNOSIS — K429 Umbilical hernia without obstruction or gangrene: Secondary | ICD-10-CM | POA: Diagnosis not present

## 2018-11-11 DIAGNOSIS — F419 Anxiety disorder, unspecified: Secondary | ICD-10-CM | POA: Diagnosis not present

## 2018-11-11 DIAGNOSIS — Z880 Allergy status to penicillin: Secondary | ICD-10-CM | POA: Diagnosis not present

## 2018-11-11 DIAGNOSIS — Z9884 Bariatric surgery status: Secondary | ICD-10-CM | POA: Diagnosis not present

## 2018-11-11 DIAGNOSIS — K432 Incisional hernia without obstruction or gangrene: Secondary | ICD-10-CM | POA: Diagnosis not present

## 2018-11-11 DIAGNOSIS — R51 Headache: Secondary | ICD-10-CM | POA: Diagnosis not present

## 2018-11-11 DIAGNOSIS — K219 Gastro-esophageal reflux disease without esophagitis: Secondary | ICD-10-CM | POA: Diagnosis not present

## 2018-11-11 DIAGNOSIS — K420 Umbilical hernia with obstruction, without gangrene: Secondary | ICD-10-CM | POA: Diagnosis not present

## 2018-11-11 DIAGNOSIS — Z882 Allergy status to sulfonamides status: Secondary | ICD-10-CM | POA: Diagnosis not present

## 2018-11-11 DIAGNOSIS — Z79899 Other long term (current) drug therapy: Secondary | ICD-10-CM | POA: Diagnosis not present

## 2018-11-11 DIAGNOSIS — K429 Umbilical hernia without obstruction or gangrene: Secondary | ICD-10-CM | POA: Diagnosis not present

## 2019-01-03 DIAGNOSIS — E669 Obesity, unspecified: Secondary | ICD-10-CM | POA: Diagnosis not present

## 2019-01-03 DIAGNOSIS — Z9884 Bariatric surgery status: Secondary | ICD-10-CM | POA: Diagnosis not present

## 2019-01-03 DIAGNOSIS — Z713 Dietary counseling and surveillance: Secondary | ICD-10-CM | POA: Diagnosis not present

## 2019-01-22 DIAGNOSIS — M150 Primary generalized (osteo)arthritis: Secondary | ICD-10-CM | POA: Insufficient documentation

## 2019-01-27 ENCOUNTER — Telehealth: Payer: Self-pay

## 2019-01-27 ENCOUNTER — Telehealth (INDEPENDENT_AMBULATORY_CARE_PROVIDER_SITE_OTHER): Payer: BLUE CROSS/BLUE SHIELD | Admitting: Family Medicine

## 2019-01-27 ENCOUNTER — Other Ambulatory Visit: Payer: Self-pay

## 2019-01-27 DIAGNOSIS — R42 Dizziness and giddiness: Secondary | ICD-10-CM

## 2019-01-27 DIAGNOSIS — G4700 Insomnia, unspecified: Secondary | ICD-10-CM | POA: Diagnosis not present

## 2019-01-27 DIAGNOSIS — R0683 Snoring: Secondary | ICD-10-CM | POA: Diagnosis not present

## 2019-01-27 DIAGNOSIS — G43809 Other migraine, not intractable, without status migrainosus: Secondary | ICD-10-CM | POA: Diagnosis not present

## 2019-01-27 DIAGNOSIS — F411 Generalized anxiety disorder: Secondary | ICD-10-CM

## 2019-01-27 DIAGNOSIS — G629 Polyneuropathy, unspecified: Secondary | ICD-10-CM

## 2019-01-27 DIAGNOSIS — R519 Headache, unspecified: Secondary | ICD-10-CM

## 2019-01-27 MED ORDER — PANTOPRAZOLE SODIUM 40 MG PO TBEC: 40.0000 mg | DELAYED_RELEASE_TABLET | Freq: Every day | ORAL | 3 refills | Status: DC | PRN

## 2019-01-27 MED ORDER — TIZANIDINE HCL 2 MG PO CAPS: ORAL_CAPSULE | ORAL | 2 refills | Status: AC

## 2019-01-27 MED ORDER — CLONAZEPAM 1 MG PO TABS: 1.0000 mg | ORAL_TABLET | Freq: Every evening | ORAL | 2 refills | Status: AC | PRN

## 2019-01-27 MED ORDER — MECLIZINE HCL 25 MG PO TABS: 25.0000 mg | ORAL_TABLET | Freq: Three times a day (TID) | ORAL | 0 refills | Status: AC | PRN

## 2019-01-27 MED ORDER — ONDANSETRON HCL 4 MG PO TABS: 4.0000 mg | ORAL_TABLET | Freq: Three times a day (TID) | ORAL | 0 refills | Status: DC | PRN

## 2019-01-27 MED ORDER — FLUOXETINE HCL 20 MG PO TABS: 40.0000 mg | ORAL_TABLET | Freq: Every day | ORAL | 1 refills | Status: DC

## 2019-01-27 MED ORDER — RIZATRIPTAN BENZOATE 5 MG PO TBDP: ORAL_TABLET | ORAL | 2 refills | Status: AC

## 2019-01-27 MED ORDER — GABAPENTIN 300 MG PO CAPS
300.0000 mg | ORAL_CAPSULE | Freq: Every day | ORAL | 1 refills | Status: DC
Start: 2019-01-27 — End: 2019-04-27

## 2019-01-27 NOTE — Progress Notes (Signed)
Virtual Visit via Telephone Note  I connected with Jackie Ellis on 01/27/19 at 1:36 PM by telephone and verified that I am speaking with the correct person using two identifiers.   I discussed the limitations, risks, security and privacy concerns of performing an evaluation and management service by telephone and the availability of in person appointments. I also discussed with the patient that there may be a patient responsible charge related to this service. The patient expressed understanding and agreed to proceed, consent obtained  Chief complaint: Med refills.   History of Present Illness: Jackie Ellis is a 53 y.o. female   Previously identified Dr. Creta Levin as primary care provider.  Establish care visit July 2019 with Dr. Creta Levin.  Plans on me as PCP - I saw her for acute visit in March.  Most of care   History of gastric bypass Followed by [facility] for metabolic and weight loss surgery, history of gastric bypass.  Status post repair of incisional hernia in August. Ongoing nutritional follow-up, telemedicine appointment October 19.  Phentermine 37.5 mg tablet #30 prescribed July 23.  History of vertigo: Treated by Dr. Creta Levin in June with meclizine 25 mg 3 times daily as needed and referred to ENT.  Has used meclizine for dizziness as needed - about once a week. Notes with sinuses at times.   Migraine headache: Seen by me March 11 with typical migraine, prescribed rizatriptan, Zofran, tizanidine for spasms as thought to have possible component of tension headache.  30 tizanidine with 2 refills.  Last migraine last Wednesday, 3 per month. Using rizatriptan per episode. Lasting 1-1.5 days.  Prior 1/month, now 3/month since July. Using zofran and tizanidine.   Insomnia: Has treated with clonazepam 1 mg as needed at bedtime. Controlled substance database (PDMP) reviewed.  Oxycodone No. 5 on August 27, phentermine No. 30 on July 23, tramadol No. 20 on July 2.   Clonazepam last filled for #30 in July 2019.  Peripheral neuropathy Gabapentin 300 mg nightly. Burning in feet, legs, tingling. Longstanding.   Allergic rhinitis Olopatadine prescribed in July 2019.  Currently taking Zyrtec QAm with insufficient treatment.  Nasal congestion at night. Tylenol cold and sinus. Burning feeling with steroid in past. Not using now, no saline NS.   Anxiety: Treated with Prozac 20 mg daily, Klonopin at bedtime as needed as above. Taking klonopin every other night. Only sleeping 3 hours. Wakes up with mind on things. Exhausted during the day. Does have snoring, but no known pauses. Min change with use of klonopin.  Some increased anxiety with pandemic.  More anxious than depressed. No recent therapist.  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   GAD 7 : Generalized Anxiety Score 10/07/2017  Nervous, Anxious, on Edge 2  Control/stop worrying 2  Worry too much - different things 1  Trouble relaxing 3  Restless 2  Easily annoyed or irritable 2  Afraid - awful might happen 0  Total GAD 7 Score 12  Anxiety Difficulty Somewhat difficult  Score 17 today  Depression screen [facility] 2/9 09/03/2018 05/26/2018 10/07/2017 04/18/2017 02/21/2017  Decreased Interest 0 0 0 0 0  Down, Depressed, Hopeless 0 0 0 0 0  PHQ - 2 Score 0 0 0 0 0  Altered sleeping - - - - -  Tired, decreased energy - - - - -  Change in appetite - - - - -  Feeling bad or failure about yourself  - - - - -  Trouble concentrating - - - - -  Moving slowly or fidgety/restless - - - - -  Suicidal thoughts - - - - -  PHQ-9 Score - - - - -  Difficult doing work/chores - - - - -  Score of 12 today.  No SI/HI.   GERD Takes Protonix 40 mg. GERD, acid reflux.   Lap band, then port and band replaced, followed by band erosion into stomach- lap band removed in 2013. Partial gastrectomy in 2017.  Followed by [facility].  General surgery Dr. Starleen Arms.  Still taking protonix - every other day.  Some digestive discomfort, IBS. Uses otc Colace, fleets and stool softener.  Hx of hiatal hernia repair. GI: Dr. Chevis Pretty, but mostly followed by Dr. Bufford Lope.   Lipid screening: Lipid panel from [facility] system reviewed July 2, total 159, trig 144, LDL 97, HDL 53.  Glucose 83 on July 2.  Normal LFTs.  Vitamin D deficiency Reading of 21 on July 2 at [facility]. Had Rx dosing of vitamin D - labs pending tomorrow.   HM: Mammogram at [facility] 10/07/18 Colonoscopy 10/22/17. utd on pap testing, GYN eval/exam 09/16/18    Patient Active Problem List   Diagnosis Date Noted  . Abdominal pain, acute, generalized 03/05/2016  . 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 Acid reflux 10/05/2015  . Gastroesophageal reflux disease with esophagitis 09/19/2015  . Migraine 07/31/2015  . Morbid obesity (HCC) 07/31/2015  . Family history of breast cancer in sister 07/21/2015  . 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 History of removal of laparoscopic gastric banding device 07/18/2015  . Knee osteoarthritis 07/18/2015  . Metatarsal stress fracture 03/08/2012  . KIDNEY DISEASE 02/25/2008  . FLANK PAIN, LEFT 02/25/2008  . ALLERGIC RHINITIS 01/26/2008   Past Medical History:  Diagnosis Date  . Allergy   . GERD (gastroesophageal reflux disease)    Past Surgical History:  Procedure Laterality Date  . ABDOMINAL HYSTERECTOMY    . GASTRECTOMY    . TUBAL LIGATION    . TUBAL LIGATION     Allergies  Allergen Reactions  . Latex Hives, Itching and Swelling  . Shellfish Allergy Anaphylaxis  . Aspirin   . Nitrofurantoin     REACTION: rash on face  . Other Itching    Multiple fruits and certain seeds -itching and rash  . Penicillins   . Sulfa Antibiotics Other (See Comments)    blisters  . Sulfonamide Derivatives   . Codeine Rash  . Penicillins Rash   Prior to Admission medications   Medication Sig Start Date End Date Taking? Authorizing Provider   cetirizine (ZYRTEC) 10 MG tablet Take 1 tablet (10 mg total) by mouth daily. 09/03/18  Yes Doristine Bosworth, MD  clonazePAM (KLONOPIN) 1 MG tablet Take 1 tablet (1 mg total) by mouth at bedtime as needed for anxiety. 10/07/17  Yes Stallings, Zoe A, MD  diclofenac sodium (VOLTAREN) 1 % GEL Apply 4 g topically 4 (four) times daily. 10/07/17  Yes Stallings, Zoe A, MD  FLUoxetine (PROZAC) 20 MG tablet Take 1 tablet (20 mg total) by mouth daily. Take half tablet daily for the first week, then increase to 1 full tablet daily 10/07/17  Yes Stallings, Zoe A, MD  gabapentin (NEURONTIN) 300 MG capsule Take 1 capsule (300 mg total) by mouth at bedtime. 10/07/17  Yes Doristine Bosworth, MD  meclizine (ANTIVERT) 25 MG tablet Take 1 tablet (25 mg total) by mouth 3 (three) times daily as needed for dizziness. 09/03/18  Yes Stallings,  Zoe A, MD  Multiple Vitamin (MULTIVITAMIN) tablet Take 1 tablet by mouth daily.   Yes [provider]  ondansetron (ZOFRAN) 4 MG tablet Take 1 tablet (4 mg total) by mouth every 8 (eight) hours as needed for nausea or vomiting. 05/26/18  Yes Wendie Agreste, MD  pantoprazole (PROTONIX) 40 MG tablet Take 1 tablet (40 mg total) by mouth daily. 10/07/17  Yes Stallings, Zoe A, MD  polyethylene glycol (MIRALAX / GLYCOLAX) packet Take 17 g by mouth daily.   Yes [provider]  rizatriptan (MAXALT-MLT) 5 MG disintegrating tablet DISSOLVE ONE TABLET BY MOUTH BY MOUTH AS NEEDED FOR MIGRAINE. MAY REPEAT IN 2 HOURS AS NEEDED. MAXIMUM OF 6 TABLETS IN 24 HOURS 05/26/18  Yes Wendie Agreste, MD  tizanidine (ZANAFLEX) 2 MG capsule TAKE 1 CAPSULE(2 MG) BY MOUTH EVERY 6 HOURS AS NEEDED FOR MUSCLE SPASMS 05/26/18  Yes Wendie Agreste, MD   Social History   Socioeconomic History  . Marital status: Single    Spouse name: Not on file  . Number of children: Not on file  . Years of education: Not on file  . Highest education level: Not on file  Occupational History  . Not on file  Social  Needs  . Financial resource strain: Not on file  . Food insecurity    Worry: Not on file    Inability: Not on file  . Transportation needs    Medical: Not on file    Non-medical: Not on file  Tobacco Use  . Smoking status: Never Smoker  . Smokeless tobacco: Never Used  Substance and Sexual Activity  . Alcohol use: No    Alcohol/week: 0.0 standard drinks  . Drug use: No  . Sexual activity: Not on file  Lifestyle  . Physical activity    Days per week: Not on file    Minutes per session: Not on file  . Stress: Not on file  Relationships  . Social Herbalist on phone: Not on file    Gets together: Not on file    Attends religious service: Not on file    Active member of club or organization: Not on file    Attends meetings of clubs or organizations: Not on file    Relationship status: Not on file  . Intimate partner violence    Fear of current or ex partner: Not on file    Emotionally abused: Not on file    Physically abused: Not on file    Forced sexual activity: Not on file  Other Topics Concern  . Not on file  Social History Narrative  . Not on file     Observations/Objective: There were no vitals filed for this visit. No distress on phone.  Appropriate responses.  Understanding of plan expressed.  Assessment and Plan: Other migraine without status migrainosus, not intractable - Plan: ondansetron (ZOFRAN) 4 MG tablet, rizatriptan (MAXALT-MLT) 5 MG disintegrating tablet, tizanidine (ZANAFLEX) 2 MG capsule Vertigo - Plan: meclizine (ANTIVERT) 25 MG tablet Intractable headache, unspecified chronicity pattern, unspecified headache type - Plan: tizanidine (ZANAFLEX) 2 MG capsule  -Headache may be related to sinus issues as well as stress/tension.  -Refer to headache specialist  -Flonase discussed for allergies, with saline nasal spray as needed throughout the day as well  -Discuss dizziness with neuro/headache specialist.  Continue meclizine as needed.   Improving sinus symptoms may also help vertigo.  -Refilled Zanaflex for now, rizatriptan as needed.  Zofran if needed for  nausea.  Insomnia, unspecified type - Plan: clonazePAM (KLONOPIN) 1 MG tablet  -Psychological component likely, but will also have evaluated by sleep specialist given snoring and daytime somnolence, wakening.  Differential includes OSA.  Klonopin refilled for now.  Anxiety state - Plan: FLUoxetine (PROZAC) 20 MG tablet  -Counseling recommended, number provided.  Increase Prozac for improved treatment of anxiety symptoms.  Recheck 3 weeks  Neuropathy - Plan: gabapentin (NEURONTIN) 300 MG capsule  -Continue same, discuss further next visit.  Follow Up Instructions:  3 weeks.    Patient Instructions  I do recommend contacting therapist.  [facility]: [phone].  Increase fluoxetine to 40mg  per day for now.  I will refer you to sleep specialist. Klonopin ok for now. Try melatonin nightly as well.   Try flonase 1 spray per day in each nostril . Then saline spray as needed. Recheck 3 weeks. Congestion may be impacting dizziness, but meclizine refilled as well if needed.  I will refer you to headache specialist.  No other change in meds for now.  Thanks for taking my call today.   Insomnia Insomnia is a sleep disorder that makes it difficult to fall asleep or stay asleep. Insomnia can cause fatigue, low energy, difficulty concentrating, mood swings, and poor performance at work or school. There are three different ways to classify insomnia:  Difficulty falling asleep.  Difficulty staying asleep.  Waking up too early in the morning. Any type of insomnia can be long-term (chronic) or short-term (acute). Both are common. Short-term insomnia usually lasts for three months or less. Chronic insomnia occurs at least three times a week for longer than three months. What are the causes? Insomnia may be caused by another condition, situation, or  substance, such as:  Anxiety.  Certain medicines.  Gastroesophageal reflux disease (GERD) or other gastrointestinal conditions.  Asthma or other breathing conditions.  Restless legs syndrome, sleep apnea, or other sleep disorders.  Chronic pain.  Menopause.  Stroke.  Abuse of alcohol, tobacco, or illegal drugs.  Mental health conditions, such as depression.  Caffeine.  Neurological disorders, such as Alzheimer's disease.  An overactive thyroid (hyperthyroidism). Sometimes, the cause of insomnia may not be known. What increases the risk? Risk factors for insomnia include:  Gender. Women are affected more often than men.  Age. Insomnia is more common as you get older.  Stress.  Lack of exercise.  Irregular work schedule or working night shifts.  Traveling between different time zones.  Certain medical and mental health conditions. What are the signs or symptoms? If you have insomnia, the main symptom is having trouble falling asleep or having trouble staying asleep. This may lead to other symptoms, such as:  Feeling fatigued or having low energy.  Feeling nervous about going to sleep.  Not feeling rested in the morning.  Having trouble concentrating.  Feeling irritable, anxious, or depressed. How is this diagnosed? This condition may be diagnosed based on:  Your symptoms and medical history. Your health care provider may ask about: ? Your sleep habits. ? Any medical conditions you have. ? Your mental health.  A physical exam. How is this treated? Treatment for insomnia depends on the cause. Treatment may focus on treating an underlying condition that is causing insomnia. Treatment may also include:  Medicines to help you sleep.  Counseling or therapy.  Lifestyle adjustments to help you sleep better. Follow these instructions at home: Eating and drinking   Limit or avoid alcohol, caffeinated beverages, and cigarettes, especially close to  bedtime. These can disrupt your sleep.  Do not eat a large meal or eat spicy foods right before bedtime. This can lead to digestive discomfort that can make it hard for you to sleep. Sleep habits   Keep a sleep diary to help you and your health care provider figure out what could be causing your insomnia. Write down: ? When you sleep. ? When you wake up during the night. ? How well you sleep. ? How rested you feel the next day. ? Any side effects of medicines you are taking. ? What you eat and drink.  Make your bedroom a dark, comfortable place where it is easy to fall asleep. ? Put up shades or blackout curtains to block light from outside. ? Use a white noise machine to block noise. ? Keep the temperature cool.  Limit screen use before bedtime. This includes: ? Watching TV. ? Using your smartphone, tablet, or computer.  Stick to a routine that includes going to bed and waking up at the same times every day and night. This can help you fall asleep faster. Consider making a quiet activity, such as reading, part of your nighttime routine.  Try to avoid taking naps during the day so that you sleep better at night.  Get out of bed if you are still awake after 15 minutes of trying to sleep. Keep the lights down, but try reading or doing a quiet activity. When you feel sleepy, go back to bed. General instructions  Take over-the-counter and prescription medicines only as told by your health care provider.  Exercise regularly, as told by your health care provider. Avoid exercise starting several hours before bedtime.  Use relaxation techniques to manage stress. Ask your health care provider to suggest some techniques that may work well for you. These may include: ? Breathing exercises. ? Routines to release muscle tension. ? Visualizing peaceful scenes.  Make sure that you drive carefully. Avoid driving if you feel very sleepy.  Keep all follow-up visits as told by your health care  provider. This is important. Contact a health care provider if:  You are tired throughout the day.  You have trouble in your daily routine due to sleepiness.  You continue to have sleep problems, or your sleep problems get worse. Get help right away if:  You have serious thoughts about hurting yourself or someone else. If you ever feel like you may hurt yourself or others, or have thoughts about taking your own life, get help right away. You can go to your nearest emergency department or call:  Your local emergency services (911 in the [place]  A suicide crisis helpline, such as the National Suicide Prevention Lifeline at (931) 531-0061. This is open 24 hours a day. Summary  Insomnia is a sleep disorder that makes it difficult to fall asleep or stay asleep.  Insomnia can be long-term (chronic) or short-term (acute).  Treatment for insomnia depends on the cause. Treatment may focus on treating an underlying condition that is causing insomnia.  Keep a sleep diary to help you and your health care provider figure out what could be causing your insomnia. This information is not intended to replace advice given to you by your health care provider. Make sure you discuss any questions you have with your health care provider. Document Released: 02/29/2000 Document Revised: 02/13/2017 Document Reviewed: 12/11/2016 Elsevier Patient Education  2020 [employer].       I discussed the assessment and treatment plan with the patient. The  patient was provided an opportunity to ask questions and all were answered. The patient agreed with the plan and demonstrated an understanding of the instructions.   The patient was advised to call back or seek an in-person evaluation if the symptoms worsen or if the condition fails to improve as anticipated.  I provided 31 minutes of non-face-to-face time during this encounter.  Signed,   Meredith Staggers, MD Primary Care at [facility].  01/27/19

## 2019-01-27 NOTE — Telephone Encounter (Signed)
[facility] called requesting clarification on the pt's script written by Dr. Carlota Raspberry. According to the pharmacy one of the medications was written with two sets of instructions. Pharmacy number [phone]

## 2019-01-27 NOTE — Progress Notes (Signed)
CC-Refill on all med- Patient need refill on all meds and would like to know if there is something better out there for allergies as well. Patient Is taking zyrtec at the moment but do not seem to be working to well. All meds has been pend. GAd7=17 and PHQ9=12

## 2019-01-27 NOTE — Patient Instructions (Addendum)
I do recommend contacting therapist.  [facility]: [phone].  Increase fluoxetine to 40mg  per day for now.  I will refer you to sleep specialist. Klonopin ok for now. Try melatonin nightly as well.   Try flonase 1 spray per day in each nostril . Then saline spray as needed. Recheck 3 weeks. Congestion may be impacting dizziness, but meclizine refilled as well if needed.  I will refer you to headache specialist.  No other change in meds for now.  Thanks for taking my call today.   Insomnia Insomnia is a sleep disorder that makes it difficult to fall asleep or stay asleep. Insomnia can cause fatigue, low energy, difficulty concentrating, mood swings, and poor performance at work or school. There are three different ways to classify insomnia:  Difficulty falling asleep.  Difficulty staying asleep.  Waking up too early in the morning. Any type of insomnia can be long-term (chronic) or short-term (acute). Both are common. Short-term insomnia usually lasts for three months or less. Chronic insomnia occurs at least three times a week for longer than three months. What are the causes? Insomnia may be caused by another condition, situation, or substance, such as:  Anxiety.  Certain medicines.  Gastroesophageal reflux disease (GERD) or other gastrointestinal conditions.  Asthma or other breathing conditions.  Restless legs syndrome, sleep apnea, or other sleep disorders.  Chronic pain.  Menopause.  Stroke.  Abuse of alcohol, tobacco, or illegal drugs.  Mental health conditions, such as depression.  Caffeine.  Neurological disorders, such as Alzheimer's disease.  An overactive thyroid (hyperthyroidism). Sometimes, the cause of insomnia may not be known. What increases the risk? Risk factors for insomnia include:  Gender. Women are affected more often than men.  Age. Insomnia is more common as you get older.  Stress.  Lack of exercise.  Irregular  work schedule or working night shifts.  Traveling between different time zones.  Certain medical and mental health conditions. What are the signs or symptoms? If you have insomnia, the main symptom is having trouble falling asleep or having trouble staying asleep. This may lead to other symptoms, such as:  Feeling fatigued or having low energy.  Feeling nervous about going to sleep.  Not feeling rested in the morning.  Having trouble concentrating.  Feeling irritable, anxious, or depressed. How is this diagnosed? This condition may be diagnosed based on:  Your symptoms and medical history. Your health care provider may ask about: ? Your sleep habits. ? Any medical conditions you have. ? Your mental health.  A physical exam. How is this treated? Treatment for insomnia depends on the cause. Treatment may focus on treating an underlying condition that is causing insomnia. Treatment may also include:  Medicines to help you sleep.  Counseling or therapy.  Lifestyle adjustments to help you sleep better. Follow these instructions at home: Eating and drinking   Limit or avoid alcohol, caffeinated beverages, and cigarettes, especially close to bedtime. These can disrupt your sleep.  Do not eat a large meal or eat spicy foods right before bedtime. This can lead to digestive discomfort that can make it hard for you to sleep. Sleep habits   Keep a sleep diary to help you and your health care provider figure out what could be causing your insomnia. Write down: ? When you sleep. ? When you wake up during the night. ? How well you sleep. ? How rested you feel the next day. ? Any side effects of medicines you are taking. ?  What you eat and drink.  Make your bedroom a dark, comfortable place where it is easy to fall asleep. ? Put up shades or blackout curtains to block light from outside. ? Use a white noise machine to block noise. ? Keep the temperature cool.  Limit screen  use before bedtime. This includes: ? Watching TV. ? Using your smartphone, tablet, or computer.  Stick to a routine that includes going to bed and waking up at the same times every day and night. This can help you fall asleep faster. Consider making a quiet activity, such as reading, part of your nighttime routine.  Try to avoid taking naps during the day so that you sleep better at night.  Get out of bed if you are still awake after 15 minutes of trying to sleep. Keep the lights down, but try reading or doing a quiet activity. When you feel sleepy, go back to bed. General instructions  Take over-the-counter and prescription medicines only as told by your health care provider.  Exercise regularly, as told by your health care provider. Avoid exercise starting several hours before bedtime.  Use relaxation techniques to manage stress. Ask your health care provider to suggest some techniques that may work well for you. These may include: ? Breathing exercises. ? Routines to release muscle tension. ? Visualizing peaceful scenes.  Make sure that you drive carefully. Avoid driving if you feel very sleepy.  Keep all follow-up visits as told by your health care provider. This is important. Contact a health care provider if:  You are tired throughout the day.  You have trouble in your daily routine due to sleepiness.  You continue to have sleep problems, or your sleep problems get worse. Get help right away if:  You have serious thoughts about hurting yourself or someone else. If you ever feel like you may hurt yourself or others, or have thoughts about taking your own life, get help right away. You can go to your nearest emergency department or call:  Your local emergency services (911 in the [place]  A suicide crisis helpline, such as the [facility] at 937-141-6967. This is open 24 hours a day. Summary  Insomnia is a sleep disorder that makes it difficult to  fall asleep or stay asleep.  Insomnia can be long-term (chronic) or short-term (acute).  Treatment for insomnia depends on the cause. Treatment may focus on treating an underlying condition that is causing insomnia.  Keep a sleep diary to help you and your health care provider figure out what could be causing your insomnia. This information is not intended to replace advice given to you by your health care provider. Make sure you discuss any questions you have with your health care provider. Document Released: 02/29/2000 Document Revised: 02/13/2017 Document Reviewed: 12/11/2016 Elsevier Patient Education  2020 [employer].

## 2019-01-28 ENCOUNTER — Telehealth: Payer: Self-pay | Admitting: Family Medicine

## 2019-01-28 DIAGNOSIS — Z9049 Acquired absence of other specified parts of digestive tract: Secondary | ICD-10-CM | POA: Diagnosis not present

## 2019-01-28 DIAGNOSIS — Z713 Dietary counseling and surveillance: Secondary | ICD-10-CM | POA: Diagnosis not present

## 2019-01-28 DIAGNOSIS — E669 Obesity, unspecified: Secondary | ICD-10-CM | POA: Diagnosis not present

## 2019-01-28 DIAGNOSIS — Z48815 Encounter for surgical aftercare following surgery on the digestive system: Secondary | ICD-10-CM | POA: Diagnosis not present

## 2019-01-28 DIAGNOSIS — K912 Postsurgical malabsorption, not elsewhere classified: Secondary | ICD-10-CM | POA: Diagnosis not present

## 2019-01-28 DIAGNOSIS — Z79899 Other long term (current) drug therapy: Secondary | ICD-10-CM | POA: Diagnosis not present

## 2019-01-28 DIAGNOSIS — Z8719 Personal history of other diseases of the digestive system: Secondary | ICD-10-CM | POA: Diagnosis not present

## 2019-01-28 DIAGNOSIS — Z9884 Bariatric surgery status: Secondary | ICD-10-CM | POA: Diagnosis not present

## 2019-01-28 NOTE — Telephone Encounter (Signed)
FLUoxetine (PROZAC) 20 MG tablet [[id number]]   Pharmacy needs clarity on directions and strength please call pharmacy    [place], [place] - [address]  [place] [address]  Phone: [phone] Fax: [phone]

## 2019-01-31 ENCOUNTER — Other Ambulatory Visit: Payer: Self-pay | Admitting: Family Medicine

## 2019-01-31 DIAGNOSIS — F411 Generalized anxiety disorder: Secondary | ICD-10-CM

## 2019-01-31 MED ORDER — FLUOXETINE HCL 20 MG PO TABS: 40.0000 mg | ORAL_TABLET | Freq: Every day | ORAL | 1 refills | Status: AC

## 2019-01-31 NOTE — Progress Notes (Signed)
Clarified Rx.

## 2019-02-04 NOTE — Telephone Encounter (Signed)
Order has been clarified by provider.

## 2019-02-14 DIAGNOSIS — Z20828 Contact with and (suspected) exposure to other viral communicable diseases: Secondary | ICD-10-CM | POA: Diagnosis not present

## 2019-02-15 ENCOUNTER — Institutional Professional Consult (permissible substitution): Payer: BLUE CROSS/BLUE SHIELD | Admitting: Neurology

## 2019-02-16 ENCOUNTER — Other Ambulatory Visit: Payer: Self-pay

## 2019-02-16 ENCOUNTER — Ambulatory Visit: Payer: BLUE CROSS/BLUE SHIELD | Admitting: Family Medicine

## 2019-02-16 ENCOUNTER — Ambulatory Visit (INDEPENDENT_AMBULATORY_CARE_PROVIDER_SITE_OTHER): Payer: BLUE CROSS/BLUE SHIELD

## 2019-02-16 ENCOUNTER — Encounter: Payer: Self-pay | Admitting: Family Medicine

## 2019-02-16 VITALS — BP 114/71 | HR 64 | Temp 98.2°F | Wt 185.4 lb

## 2019-02-16 DIAGNOSIS — R0789 Other chest pain: Secondary | ICD-10-CM

## 2019-02-16 DIAGNOSIS — Z91013 Allergy to seafood: Secondary | ICD-10-CM | POA: Diagnosis not present

## 2019-02-16 DIAGNOSIS — G43809 Other migraine, not intractable, without status migrainosus: Secondary | ICD-10-CM

## 2019-02-16 DIAGNOSIS — F411 Generalized anxiety disorder: Secondary | ICD-10-CM

## 2019-02-16 DIAGNOSIS — G4700 Insomnia, unspecified: Secondary | ICD-10-CM | POA: Diagnosis not present

## 2019-02-16 DIAGNOSIS — J301 Allergic rhinitis due to pollen: Secondary | ICD-10-CM

## 2019-02-16 DIAGNOSIS — G629 Polyneuropathy, unspecified: Secondary | ICD-10-CM

## 2019-02-16 MED ORDER — EPINEPHRINE 0.3 MG/0.3ML IJ SOAJ: 0.3000 mg | INTRAMUSCULAR | 0 refills | Status: AC | PRN
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
## 2019-02-16 NOTE — Patient Instructions (Addendum)
Keep follow up with sleep specialist, headache specialist and counseling as planned. You may notice some improvement with higher dose prozac in next few weeks.   Continue Flonase, saline nasal spray for sinus issues.  EpiPen refilled if needed.   Pain on upper left side appears to be chest wall pain or possible pectoralis muscle pain.  I do not appreciate any weakness on exam.  I will check an x-ray but suspect that will be normal.  See information below on chest wall pain.  Tylenol is okay for now, gentle range of motion and stretches as tolerated.  Follow-up in the next 6 weeks or sooner if symptoms worsen or other concerns.  Thank you for coming in today and take care.   Return to the clinic or go to the nearest emergency room if any of your symptoms worsen or new symptoms occur.   Chest Wall Pain Chest wall pain is pain in or around the bones and muscles of your chest. Sometimes, an injury causes this pain. Excessive coughing or overuse of arm and chest muscles may also cause chest wall pain. Sometimes, the cause may not be known. This pain may take several weeks or longer to get better. Follow these instructions at home: Managing pain, stiffness, and swelling   If directed, put ice on the painful area: ? Put ice in a plastic bag. ? Place a towel between your skin and the bag. ? Leave the ice on for 20 minutes, 2-3 times per day. Activity  Rest as told by your health care provider.  Avoid activities that cause pain. These include any activities that use your chest muscles or your abdominal and side muscles to lift heavy items. Ask your health care provider what activities are safe for you. General instructions   Take over-the-counter and prescription medicines only as told by your health care provider.  Do not use any products that contain nicotine or tobacco, such as cigarettes, e-cigarettes, and chewing tobacco. These can delay healing after injury. If you need help quitting,  ask your health care provider.  Keep all follow-up visits as told by your health care provider. This is important. Contact a health care provider if:  You have a fever.  Your chest pain becomes worse.  You have new symptoms. Get help right away if:  You have nausea or vomiting.  You feel sweaty or light-headed.  You have a cough with mucus from your lungs (sputum) or you cough up blood.  You develop shortness of breath. These symptoms may represent a serious problem that is an emergency. Do not wait to see if the symptoms will go away. Get medical help right away. Call your local emergency services (911 in the [place] Do not drive yourself to the hospital. Summary  Chest wall pain is pain in or around the bones and muscles of your chest.  Depending on the cause, it may be treated with ice, rest, medicines, and avoiding activities that cause pain.  Contact a health care provider if you have a fever, worsening chest pain, or new symptoms.  Get help right away if you feel light-headed or you develop shortness of breath. These symptoms may be an emergency. This information is not intended to replace advice given to you by your health care provider. Make sure you discuss any questions you have with your health care provider. Document Released: 03/03/2005 Document Revised: 09/03/2017 Document Reviewed: 09/03/2017 Elsevier Patient Education  [employer].   If you have lab work  done today you will be contacted with your lab results within the next 2 weeks.  If you have not heard from [place] then please contact us. The fastest way to get your results is to register for My Chart.   IF you received an x-ray today, you will receive an invoice from [facility]. Please contact [facility] at [phone] with questions or concerns regarding your invoice.   IF you received labwork today, you will receive an invoice from [place]. Please contact [facility] at [phone]  with questions or concerns regarding your invoice.   Our billing staff will not be able to assist you with questions regarding bills from these companies.  You will be contacted with the lab results as soon as they are available. The fastest way to get your results is to activate your My Chart account. Instructions are located on the last page of this paperwork. If you have not heard from [place] regarding the results in 2 weeks, please contact this office.

## 2019-02-19 ENCOUNTER — Encounter: Payer: Self-pay | Admitting: Family Medicine

## 2019-03-03 ENCOUNTER — Institutional Professional Consult (permissible substitution): Payer: BLUE CROSS/BLUE SHIELD | Admitting: Neurology

## 2019-03-24 ENCOUNTER — Other Ambulatory Visit: Payer: Self-pay

## 2019-03-24 ENCOUNTER — Ambulatory Visit: Payer: BC Managed Care – PPO | Admitting: Neurology

## 2019-03-24 ENCOUNTER — Encounter: Payer: Self-pay | Admitting: Neurology

## 2019-03-24 VITALS — BP 111/77 | HR 61 | Temp 96.9°F | Ht 65.0 in | Wt 182.0 lb

## 2019-03-24 DIAGNOSIS — K911 Postgastric surgery syndromes: Secondary | ICD-10-CM | POA: Diagnosis not present

## 2019-03-24 DIAGNOSIS — Z9884 Bariatric surgery status: Secondary | ICD-10-CM

## 2019-03-24 DIAGNOSIS — R519 Headache, unspecified: Secondary | ICD-10-CM | POA: Diagnosis not present

## 2019-03-24 DIAGNOSIS — R0681 Apnea, not elsewhere classified: Secondary | ICD-10-CM | POA: Insufficient documentation

## 2019-03-24 DIAGNOSIS — R351 Nocturia: Secondary | ICD-10-CM

## 2019-03-24 DIAGNOSIS — R0683 Snoring: Secondary | ICD-10-CM | POA: Insufficient documentation

## 2019-03-24 DIAGNOSIS — G4719 Other hypersomnia: Secondary | ICD-10-CM

## 2019-03-24 DIAGNOSIS — K219 Gastro-esophageal reflux disease without esophagitis: Secondary | ICD-10-CM

## 2019-03-24 DIAGNOSIS — G478 Other sleep disorders: Secondary | ICD-10-CM | POA: Insufficient documentation

## 2019-03-24 DIAGNOSIS — G4700 Insomnia, unspecified: Secondary | ICD-10-CM
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
## 2019-03-24 NOTE — Patient Instructions (Signed)

## 2019-03-30 ENCOUNTER — Encounter: Payer: Self-pay | Admitting: Family Medicine

## 2019-03-30 ENCOUNTER — Other Ambulatory Visit: Payer: Self-pay

## 2019-03-30 ENCOUNTER — Ambulatory Visit: Payer: BC Managed Care – PPO | Admitting: Family Medicine

## 2019-03-30 VITALS — BP 113/74 | HR 76 | Temp 98.3°F | Ht 65.0 in | Wt 182.8 lb

## 2019-03-30 DIAGNOSIS — R0982 Postnasal drip: Secondary | ICD-10-CM | POA: Diagnosis not present

## 2019-03-30 DIAGNOSIS — N951 Menopausal and female climacteric states: Secondary | ICD-10-CM | POA: Diagnosis not present

## 2019-03-30 DIAGNOSIS — K912 Postsurgical malabsorption, not elsewhere classified: Secondary | ICD-10-CM | POA: Diagnosis not present

## 2019-03-30 DIAGNOSIS — G629 Polyneuropathy, unspecified: Secondary | ICD-10-CM | POA: Diagnosis not present

## 2019-03-30 MED ORDER — IPRATROPIUM BROMIDE 0.06 % NA SOLN: 1.0000 | Freq: Four times a day (QID) | NASAL | 5 refills | Status: AC | PRN

## 2019-03-30 MED ORDER — GABAPENTIN 100 MG PO CAPS: 100.0000 mg | ORAL_CAPSULE | Freq: Every day | ORAL | 0 refills | Status: AC

## 2019-03-30 NOTE — Patient Instructions (Addendum)
Try atrovent nasal spray for congestion or postnasal drip. That may allow you to decrease the sudafed use.   Additional 100mg  gabapentin for now at bedtime.  I will research some options for hot flushes and let you know, but will check thyroid testing today as well.   Return to the clinic or go to the nearest emergency room if any of your symptoms worsen or new symptoms occur.    If you have lab work done today you will be contacted with your lab results within the next 2 weeks.  If you have not heard from then please contact us. The fastest way to get your results is to register for My Chart.   IF you received an x-ray today, you will receive an invoice from [facility]. Please contact [facility] at [phone] with questions or concerns regarding your invoice.   IF you received labwork today, you will receive an invoice from [facility]. Please contact [facility] at [phone] with questions or concerns regarding your invoice.   Our billing staff will not be able to assist you with questions regarding bills from these companies.  You will be contacted with the lab results as soon as they are available. The fastest way to get your results is to activate your My Chart account. Instructions are located on the last page of this paperwork. If you have not heard from [phone] regarding the results in 2 weeks, please contact this office.

## 2019-03-30 NOTE — Progress Notes (Signed)
Subjective:  Patient ID: Jackie Ellis, female    DOB: 10/14/1965  Age: 54 y.o. MRN: [id number]  CC:  Chief Complaint  Patient presents with  . Follow-up    Pt is still suffering from a nasel drip. pt would like an increace in her gabapentin. and has some questions about her ongoing hot flashes.    HPI Nikiyah Fackler presents for   Peripheral neuropathy: Gabapentin 300 mg nightly for tingling in hands and feet as well as neck pain discussed at December 2 visit.  Was doing okay at that current dose. Notices symptoms down both legs, palms - same as in past.  Increase in intensity of sx's - tingling in hands feet more at night. More exercise/more activity.   Postnasal drip: Discussed #12.  Was on Zyrtec with insufficient treatment at that time.  Burning with steroid in the past after 3 days.  Still taking zyrtec, saline ns daily.  Sudafed 12 hour - taking once per day. 2 tabs. Less nausea with drainage. Benadryl.  Not much congestion. Just pnd.   Hot flushes Past 4 years.  Worse past month.  Tried black cohosh otc 3 weeks. No change in symptoms.  Unable to take estrogen d/t FH of ovarian, breast CA.  On prozac 40mg  qd.   Has list of labs to draw for her bariatric specialist,who will then review those labs diagnosis of postresection malabsorption.    History Patient Active Problem List   Diagnosis Date Noted  . Sleep-related headache 03/24/2019  . GERD with apnea 03/24/2019  . Snoring 03/24/2019  . Insomnia disorder, with other sleep disorder, recurrent 03/24/2019  . Nocturia more than twice per night 03/24/2019  . Excessive daytime sleepiness 03/24/2019  . Abdominal pain, acute, generalized 03/05/2016  . 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 Acid reflux 10/05/2015  . Gastroesophageal reflux disease with esophagitis 09/19/2015  . Migraine 07/31/2015  . Morbid obesity ([place]) 07/31/2015  . Family history of breast cancer in sister  07/21/2015  . 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 Status post gastric bypass for obesity 07/18/2015  . Knee osteoarthritis 07/18/2015  . Metatarsal stress fracture 03/08/2012  . KIDNEY DISEASE 02/25/2008  . FLANK PAIN, LEFT 02/25/2008  . ALLERGIC RHINITIS 01/26/2008   Past Medical History:  Diagnosis Date  . Allergy   . GERD (gastroesophageal reflux disease)    Past Surgical History:  Procedure Laterality Date  . ABDOMINAL HYSTERECTOMY    . GASTRECTOMY    . TUBAL LIGATION    . TUBAL LIGATION     Allergies  Allergen Reactions  . Latex Hives, Itching and Swelling  . Shellfish Allergy Anaphylaxis  . Aspirin   . Nitrofurantoin     REACTION: rash on face  . Other Itching    Multiple fruits and certain seeds -itching and rash  . Penicillins   . Sulfa Antibiotics Other (See Comments)    blisters  . Sulfonamide Derivatives   . Codeine Rash  . Penicillins Rash   Prior to Admission medications   Medication Sig Start Date End Date Taking? Authorizing Provider  cetirizine (ZYRTEC) 10 MG tablet Take 1 tablet (10 mg total) by mouth daily. 09/03/18  Yes Forrest Moron, MD  clonazePAM (KLONOPIN) 1 MG tablet Take 1 tablet (1 mg total) by mouth at bedtime as needed for anxiety. 01/27/19  Yes Wendie Agreste, MD  diclofenac sodium (VOLTAREN) 1 % GEL Apply 4 g topically 4 (four) times daily. 10/07/17  Yes Stallings,  Zoe A, MD  EPINEPHrine (EPIPEN 2-PAK) 0.3 mg/0.3 mL IJ SOAJ injection Inject 0.3 mLs (0.3 mg total) into the muscle as needed for anaphylaxis. 02/16/19  Yes Shade Flood, MD  FLUoxetine (PROZAC) 20 MG tablet Take 2 tablets (40 mg total) by mouth daily. 01/31/19  Yes Shade Flood, MD  gabapentin (NEURONTIN) 300 MG capsule Take 1 capsule (300 mg total) by mouth at bedtime. 01/27/19  Yes Shade Flood, MD  meclizine (ANTIVERT) 25 MG tablet Take 1 tablet (25 mg total) by mouth 3 (three) times daily as needed for dizziness. 01/27/19  Yes Shade Flood, MD   Multiple Vitamin (MULTIVITAMIN) tablet Take 1 tablet by mouth daily.   Yes [provider]  ondansetron (ZOFRAN) 4 MG tablet Take 1 tablet (4 mg total) by mouth every 8 (eight) hours as needed for nausea or vomiting. 01/27/19  Yes Shade Flood, MD  pantoprazole (PROTONIX) 40 MG tablet Take 1 tablet (40 mg total) by mouth daily as needed. 01/27/19  Yes Shade Flood, MD  rizatriptan (MAXALT-MLT) 5 MG disintegrating tablet DISSOLVE ONE TABLET BY MOUTH BY MOUTH AS NEEDED FOR MIGRAINE. MAY REPEAT IN 2 HOURS AS NEEDED. MAXIMUM OF 6 TABLETS IN 24 HOURS 01/27/19  Yes Shade Flood, MD  tizanidine (ZANAFLEX) 2 MG capsule TAKE 1 CAPSULE(2 MG) BY MOUTH EVERY 6 HOURS AS NEEDED FOR MUSCLE SPASMS 01/27/19  Yes Shade Flood, MD   Social History   Socioeconomic History  . Marital status: Single    Spouse name: Not on file  . Number of children: Not on file  . Years of education: Not on file  . Highest education level: Not on file  Occupational History  . Not on file  Tobacco Use  . Smoking status: Never Smoker  . Smokeless tobacco: Never Used  Substance and Sexual Activity  . Alcohol use: No    Alcohol/week: 0.0 standard drinks  . Drug use: No  . Sexual activity: Not on file  Other Topics Concern  . Not on file  Social History Narrative  . Not on file   Social Determinants of Health   Financial Resource Strain:   . Difficulty of Paying Living Expenses: Not on file  Food Insecurity:   . Worried About Programme researcher, broadcasting/film/video in the Last Year: Not on file  . Ran Out of Food in the Last Year: Not on file  Transportation Needs:   . Lack of Transportation (Medical): Not on file  . Lack of Transportation (Non-Medical): Not on file  Physical Activity:   . Days of Exercise per Week: Not on file  . Minutes of Exercise per Session: Not on file  Stress:   . Feeling of Stress : Not on file  Social Connections:   . Frequency of Communication with Friends and Family: Not on  file  . Frequency of Social Gatherings with Friends and Family: Not on file  . Attends Religious Services: Not on file  . Active Member of Clubs or Organizations: Not on file  . Attends Banker Meetings: Not on file  . Marital Status: Not on file  Intimate Partner Violence:   . Fear of Current or Ex-Partner: Not on file  . Emotionally Abused: Not on file  . Physically Abused: Not on file  . Sexually Abused: Not on file    Review of Systems Per HPI.   Objective:   Vitals:   03/30/19 1408  BP: 113/74  Pulse: 76  Temp: 98.3 F (36.8 C)  TempSrc: Temporal  SpO2: 100%  Weight: 182 lb 12.8 oz (82.9 kg)  Height: 5\' 5"  (1.651 m)     Physical Exam Vitals reviewed.  Constitutional:      Appearance: She is well-developed.  HENT:     Head: Normocephalic and atraumatic.  Eyes:     Conjunctiva/sclera: Conjunctivae normal.     Pupils: Pupils are equal, round, and reactive to light.  Neck:     Vascular: No carotid bruit.  Cardiovascular:     Rate and Rhythm: Normal rate and regular rhythm.     Heart sounds: Normal heart sounds.  Pulmonary:     Effort: Pulmonary effort is normal.     Breath sounds: Normal breath sounds.  Abdominal:     Palpations: Abdomen is soft. There is no pulsatile mass.     Tenderness: There is no abdominal tenderness.  Skin:    General: Skin is warm and dry.  Neurological:     Mental Status: She is alert and oriented to person, place, and time.  Psychiatric:        Behavior: Behavior normal.        Assessment & Plan:  Roxsana Riding is a 54 y.o. female . Postresectional malabsorption syndrome - Plan: CBC, Comprehensive metabolic panel, Lipid panel, Hemoglobin A1c, Vitamin D 25 hydroxy, PTH, Intact and Calcium, Iron and TIBC([facility]), Ferritin, Copper, Serum, Folate, Magnesium, RBC, Phosphorus, Vitamin A, Vitamin B1, Vitamin B12, Vitamin K1, Serum, Zinc, gabapentin (NEURONTIN) 100 MG capsule, ipratropium (ATROVENT) 0.06 %  nasal spray  -Followed by medical bariatrics.  Monitoring labs for postsurgical malabsorption syndrome as above.  Plan to send to bariatric specialist once those have returned  Hot flushes, perimenopausal - Plan: TSH  -With increased symptoms will check TSH.  Will send message to patient regarding other options.  She is on SSRI, but on my reading fluoxetine has not necessarily been shown to be effective towards hot flashes.  Gabapentin increased for neuropathic symptoms at night which may also help hot flashes.  Option of up to 300 mg 3 times daily will be discussed.  PND (post-nasal drip) Trial of Atrovent nasal spray with potential drying/side effects discussed.  Ideally would like to see less Sudafed use, and potentially could decrease saline nasal spray if not having significant congestion.  Neuropathy  -Decreased control, add additional 100 mg nightly.  Option of higher dosing during the day for hot flashes.  Meds ordered this encounter  Medications  . gabapentin (NEURONTIN) 100 MG capsule    Sig: Take 1 capsule (100 mg total) by mouth at bedtime.    Dispense:  90 capsule    Refill:  0  . ipratropium (ATROVENT) 0.06 % nasal spray    Sig: Place 1 spray into both nostrils 4 (four) times daily as needed for rhinitis. As needed for nasal congestion    Dispense:  15 mL    Refill:  5   Patient Instructions   Try atrovent nasal spray for congestion or postnasal drip. That may allow you to decrease the sudafed use.   Additional 100mg  gabapentin for now at bedtime.  I will research some options for hot flushes and let you know, but will check thyroid testing today as well.   Return to the clinic or go to the nearest emergency room if any of your symptoms worsen or new symptoms occur.    If you have lab work done today you will be contacted with your lab results within  the next 2 weeks.  If you have not heard from [place] then please contact us. The fastest way to get your results is to  register for My Chart.   IF you received an x-ray today, you will receive an invoice from [facility]. Please contact [facility] at [phone] with questions or concerns regarding your invoice.   IF you received labwork today, you will receive an invoice from [facility]. Please contact [facility] at [phone] with questions or concerns regarding your invoice.   Our billing staff will not be able to assist you with questions regarding bills from these companies.  You will be contacted with the lab results as soon as they are available. The fastest way to get your results is to activate your My Chart account. Instructions are located on the last page of this paperwork. If you have not heard from [place] regarding the results in 2 weeks, please contact this office.         Signed, Meredith Staggers, MD [facility]

## 2019-04-11 ENCOUNTER — Encounter: Payer: Self-pay | Admitting: Family Medicine

## 2019-04-11 ENCOUNTER — Ambulatory Visit (INDEPENDENT_AMBULATORY_CARE_PROVIDER_SITE_OTHER): Payer: BC Managed Care – PPO | Admitting: Neurology

## 2019-04-11 DIAGNOSIS — G4719 Other hypersomnia: Secondary | ICD-10-CM

## 2019-04-11 DIAGNOSIS — R0683 Snoring: Secondary | ICD-10-CM

## 2019-04-11 DIAGNOSIS — R351 Nocturia: Secondary | ICD-10-CM

## 2019-04-11 DIAGNOSIS — G4710 Hypersomnia, unspecified: Secondary | ICD-10-CM | POA: Diagnosis not present

## 2019-04-11 DIAGNOSIS — Z9884 Bariatric surgery status: Secondary | ICD-10-CM

## 2019-04-11 DIAGNOSIS — G4700 Insomnia, unspecified: Secondary | ICD-10-CM

## 2019-04-11 DIAGNOSIS — G478 Other sleep disorders: Secondary | ICD-10-CM

## 2019-04-11 DIAGNOSIS — K219 Gastro-esophageal reflux disease without esophagitis: Secondary | ICD-10-CM

## 2019-04-11 DIAGNOSIS — K911 Postgastric surgery syndromes: Secondary | ICD-10-CM

## 2019-04-11 DIAGNOSIS — R519 Headache, unspecified: Secondary | ICD-10-CM

## 2019-04-11 LAB — CBC
Hematocrit: 43.4 % (ref 34.0–46.6)
Hemoglobin: 14.1 g/dL (ref 11.1–15.9)
MCH: 29.4 pg (ref 26.6–33.0)
MCHC: 32.5 g/dL (ref 31.5–35.7)
MCV: 90 fL (ref 79–97)
Platelets: 276 10*3/uL (ref 150–450)
RBC: 4.8 x10E6/uL (ref 3.77–5.28)
RDW: 13.1 % (ref 11.7–15.4)
WBC: 7.6 10*3/uL (ref 3.4–10.8)

## 2019-04-11 LAB — COMPREHENSIVE METABOLIC PANEL
ALT: 37 IU/L — ABNORMAL HIGH (ref 0–32)
AST: 35 IU/L (ref 0–40)
Albumin/Globulin Ratio: 2.1 (ref 1.2–2.2)
Albumin: 3.5 g/dL — ABNORMAL LOW (ref 3.8–4.9)
Alkaline Phosphatase: 58 IU/L (ref 39–117)
BUN/Creatinine Ratio: 23 (ref 9–23)
BUN: 16 mg/dL (ref 6–24)
Bilirubin Total: 0.2 mg/dL (ref 0.0–1.2)
CO2: 26 mmol/L (ref 20–29)
Calcium: 8.8 mg/dL (ref 8.7–10.2)
Chloride: 105 mmol/L (ref 96–106)
Creatinine, Ser: 0.71 mg/dL (ref 0.57–1.00)
GFR calc Af Amer: 112 mL/min/{1.73_m2} (ref >59)
GFR calc non Af Amer: 98 mL/min/{1.73_m2} (ref >59)
Globulin, Total: 1.7 g/dL (ref 1.5–4.5)
Glucose: 90 mg/dL (ref 65–99)
Potassium: 4.2 mmol/L (ref 3.5–5.2)
Sodium: 141 mmol/L (ref 134–144)
Total Protein: 5.2 g/dL — ABNORMAL LOW (ref 6.0–8.5)

## 2019-04-11 LAB — LIPID PANEL
Chol/HDL Ratio: 2.5 ratio (ref 0.0–4.4)
Cholesterol, Total: 161 mg/dL (ref 100–199)
HDL: 64 mg/dL (ref >39)
LDL Chol Calc (NIH): 83 mg/dL (ref 0–99)
Triglycerides: 70 mg/dL (ref 0–149)
VLDL Cholesterol Cal: 14 mg/dL (ref 5–40)

## 2019-04-11 LAB — FOLATE: Folate: 15.4 ng/mL (ref >3.0)

## 2019-04-11 LAB — PTH, INTACT AND CALCIUM: PTH: 43 pg/mL (ref 15–65)

## 2019-04-11 LAB — MAGNESIUM, RBC: Magnesium RBC: 6.8 mg/dL (ref 4.2–6.8)

## 2019-04-11 LAB — VITAMIN K1, SERUM: VITAMIN K1: 0.18 ng/mL (ref 0.13–1.88)

## 2019-04-11 LAB — IRON AND TIBC
Iron Saturation: 29 % (ref 15–55)
Iron: 103 ug/dL (ref 27–159)
Total Iron Binding Capacity: 358 ug/dL (ref 250–450)
UIBC: 255 ug/dL (ref 131–425)

## 2019-04-11 LAB — PHOSPHORUS: Phosphorus: 4 mg/dL (ref 3.0–4.3)

## 2019-04-11 LAB — COPPER, SERUM: Copper: 91 ug/dL (ref 80–158)

## 2019-04-11 LAB — VITAMIN A: Vitamin A: 51.6 ug/dL (ref 20.1–62.0)

## 2019-04-11 LAB — HEMOGLOBIN A1C
Est. average glucose Bld gHb Est-mCnc: 105 mg/dL
Hgb A1c MFr Bld: 5.3 % (ref 4.8–5.6)

## 2019-04-11 LAB — TSH: TSH: 0.771 u[IU]/mL (ref 0.450–4.500)

## 2019-04-11 LAB — ZINC: Zinc: 75 ug/dL (ref 44–115)

## 2019-04-11 LAB — FERRITIN: Ferritin: 29 ng/mL (ref 15–150)

## 2019-04-11 LAB — VITAMIN B1: Thiamine: 122.2 nmol/L (ref 66.5–200.0)

## 2019-04-11 LAB — VITAMIN B12: Vitamin B-12: 571 pg/mL (ref 232–1245)

## 2019-04-11 LAB — VITAMIN D 25 HYDROXY (VIT D DEFICIENCY, FRACTURES): Vit D, 25-Hydroxy: 25.3 ng/mL — ABNORMAL LOW (ref 30.0–100.0)

## 2019-04-12 ENCOUNTER — Encounter: Payer: Self-pay | Admitting: Radiology

## 2019-04-12 DIAGNOSIS — K912 Postsurgical malabsorption, not elsewhere classified: Secondary | ICD-10-CM | POA: Diagnosis not present

## 2019-04-12 DIAGNOSIS — E559 Vitamin D deficiency, unspecified: Secondary | ICD-10-CM | POA: Diagnosis not present

## 2019-04-12 DIAGNOSIS — Z713 Dietary counseling and surveillance: Secondary | ICD-10-CM | POA: Diagnosis not present

## 2019-04-12 DIAGNOSIS — Z9884 Bariatric surgery status: Secondary | ICD-10-CM | POA: Diagnosis not present

## 2019-04-14 ENCOUNTER — Encounter: Payer: Self-pay | Admitting: Neurology

## 2019-04-18 ENCOUNTER — Encounter: Payer: Self-pay | Admitting: Neurology

## 2019-04-18 NOTE — Progress Notes (Signed)
Summary & Diagnosis:   There was no significant degree of sleep apnea noted. The  patient slept according to this HST rather well, entered all  sleep stages and accumulated a total sleep time of over 7 hours.  There was tachycardia and bradycardia. Mild snoring by RDI. No  hypoxia.  There were 3 possible bathroom breaks recorded, identified by  high motion artefact.   Recommendations:     No apnea was identified and the patient was not suffering from  insomnia, based on the HST algorhythm.  Could this possibly signify a sleep perception disorder? In that  case an attended sleep study for confirmation of sleep duration  and architecture may be necessary.
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
## 2019-04-19 ENCOUNTER — Telehealth: Payer: Self-pay | Admitting: Neurology

## 2019-04-19 NOTE — Telephone Encounter (Signed)
-----   Message from Melvyn Novas, MD sent at 04/18/2019  6:16 PM EST ----- Summary & Diagnosis:   There was no significant degree of sleep apnea noted. The  patient slept according to this HST rather well, entered all  sleep stages and accumulated a total sleep time of over 7 hours.  There was tachycardia and bradycardia. Mild snoring by RDI. No  hypoxia.  There were 3 possible bathroom breaks recorded, identified by  high motion artefact.   Recommendations:     No apnea was identified and the patient was not suffering from  insomnia, based on the HST algorhythm.  Could this possibly signify a sleep perception disorder? In that  case an attended sleep study for confirmation of sleep duration  and architecture may be necessary.

## 2019-04-19 NOTE — Telephone Encounter (Signed)
Called the patient to review her sleep study results with her. I reviewed with her the SS findings in detail and informed her that there was no significant sleep apnea found. The only finding was there was some brady/tachycardia noted in heart rhythm. Advised the patient that she can bring this to her PCP attention in case they would need to further evaluate. Otherwise no oxygen or apnea concerns present and no correlation of the oxygen level causing headaches.

## 2019-04-27 ENCOUNTER — Encounter: Payer: Self-pay | Admitting: Family Medicine

## 2019-04-27 ENCOUNTER — Telehealth (INDEPENDENT_AMBULATORY_CARE_PROVIDER_SITE_OTHER): Payer: BC Managed Care – PPO | Admitting: Family Medicine

## 2019-04-27 ENCOUNTER — Telehealth: Payer: Self-pay | Admitting: Emergency Medicine

## 2019-04-27 VITALS — BP 107/71 | HR 66 | Ht 65.0 in | Wt 182.0 lb

## 2019-04-27 DIAGNOSIS — G629 Polyneuropathy, unspecified: Secondary | ICD-10-CM | POA: Diagnosis not present

## 2019-04-27 DIAGNOSIS — R002 Palpitations: Secondary | ICD-10-CM | POA: Diagnosis not present

## 2019-04-27 MED ORDER — GABAPENTIN 300 MG PO CAPS: 600.0000 mg | ORAL_CAPSULE | Freq: Every day | ORAL | 1 refills | Status: AC

## 2019-04-27 NOTE — Patient Instructions (Addendum)
Keep follow up with cardiology as planned. Can also schedule attended sleep study based on what they recommend.  Ok to try 600mg  gabapentin at night. Follow up in 4 weeks, Return to the clinic or go to the nearest emergency room if any of your symptoms worsen or new symptoms occur.    If you have lab work done today you will be contacted with your lab results within the next 2 weeks.  If you have not heard from then please contact us. The fastest way to get your results is to register for My Chart.   IF you received an x-ray today, you will receive an invoice from [facility]. Please contact [facility] at [phone] with questions or concerns regarding your invoice.   IF you received labwork today, you will receive an invoice from [place]. Please contact [facility] at [phone] with questions or concerns regarding your invoice.   Our billing staff will not be able to assist you with questions regarding bills from these companies.  You will be contacted with the lab results as soon as they are available. The fastest way to get your results is to activate your My Chart account. Instructions are located on the last page of this paperwork. If you have not heard from [phone] regarding the results in 2 weeks, please contact this office.

## 2019-04-27 NOTE — Telephone Encounter (Signed)
Please call patient to reschedule appt MyChart msg stated below: We had a scheduled video appt at 8:20 today, and I shared with your office I had been online since 8:05.  I apologize, but I have to log off and please have the office/nurse to reach me to reschedule or please feel free to call me at [phone].

## 2019-04-27 NOTE — Progress Notes (Signed)
Virtual Visit via Video Note  I connected with Jackie Ellis on 04/27/19 at 8:53 AM by a video enabled telemedicine application and verified that I am speaking with the correct person using two identifiers.   I discussed the limitations, risks, security and privacy concerns of performing an evaluation and management service by telephone and the availability of in person appointments. I also discussed with the patient that there may be a patient responsible charge related to this service. The patient expressed understanding and agreed to proceed, consent obtained  Chief complaint:  Chief Complaint  Patient presents with  . Follow-up    on pt's nuropothy. pt also would like to follow up on her sleep study. pt is still experincing the numbness and tingiling. pt states it wakes her up at night.     History of Present Illness: Jackie Ellis is a 54 y.o. female  Peripheral neuropathy Discussed January 13.  At that time she had some increased intensity of symptoms, with more tingling in hands and feet, more at night.  Had been more active with exercise.  Previously at 300 mg gabapentin nightly.  Additional 100 mg added, with option daytime dosing. Home sleep test January 25 without significant degree of sleep apnea noted.  Question of possible sleep perception disorder.  Attended sleep study for confirmation of sleep duration and architecture was discussed as option from sleep specialist. Also had some heart rate variability - tachycardia and bradycardia noted.  Occasional heart palpitations - has appt with cardiology this Friday. Has not yet set up attended sleep study.  Still have burning primarily at night - min change with additional dose.         Patient Active Problem List   Diagnosis Date Noted  . Sleep-related headache 03/24/2019  . GERD with apnea 03/24/2019  . Snoring 03/24/2019  . Insomnia disorder, with other sleep disorder, recurrent 03/24/2019  . Nocturia more than twice  per night 03/24/2019  . Excessive daytime sleepiness 03/24/2019  . Abdominal pain, acute, generalized 03/05/2016  . 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 Acid reflux 10/05/2015  . Gastroesophageal reflux disease with esophagitis 09/19/2015  . Migraine 07/31/2015  . Morbid obesity ([place]) 07/31/2015  . Family history of breast cancer in sister 07/21/2015  . 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 Status post gastric bypass for obesity 07/18/2015  . Knee osteoarthritis 07/18/2015  . Metatarsal stress fracture 03/08/2012  . KIDNEY DISEASE 02/25/2008  . FLANK PAIN, LEFT 02/25/2008  . ALLERGIC RHINITIS 01/26/2008   Past Medical History:  Diagnosis Date  . Allergy   . GERD (gastroesophageal reflux disease)    Past Surgical History:  Procedure Laterality Date  . ABDOMINAL HYSTERECTOMY    . GASTRECTOMY    . TUBAL LIGATION    . TUBAL LIGATION     Allergies  Allergen Reactions  . Latex Hives, Itching and Swelling  . Shellfish Allergy Anaphylaxis  . Aspirin   . Nitrofurantoin     REACTION: rash on face  . Other Itching    Multiple fruits and certain seeds -itching and rash  . Penicillins   . Sulfa Antibiotics Other (See Comments)    blisters  . Sulfonamide Derivatives   . Codeine Rash  . Penicillins Rash   Prior to Admission medications   Medication Sig Start Date End Date Taking? Authorizing Provider  cetirizine (ZYRTEC) 10 MG tablet Take 1 tablet (10 mg total) by mouth daily. 09/03/18  Yes Forrest Moron, MD  clonazePAM (  KLONOPIN) 1 MG tablet Take 1 tablet (1 mg total) by mouth at bedtime as needed for anxiety. 01/27/19  Yes Shade Flood, MD  diclofenac sodium (VOLTAREN) 1 % GEL Apply 4 g topically 4 (four) times daily. 10/07/17  Yes Stallings, Zoe A, MD  EPINEPHrine (EPIPEN 2-PAK) 0.3 mg/0.3 mL IJ SOAJ injection Inject 0.3 mLs (0.3 mg total) into the muscle as needed for anaphylaxis. 02/16/19  Yes Shade Flood, MD  FLUoxetine (PROZAC)  20 MG tablet Take 2 tablets (40 mg total) by mouth daily. 01/31/19  Yes Shade Flood, MD  gabapentin (NEURONTIN) 100 MG capsule Take 1 capsule (100 mg total) by mouth at bedtime. 03/30/19  Yes Shade Flood, MD  gabapentin (NEURONTIN) 300 MG capsule Take 1 capsule (300 mg total) by mouth at bedtime. 01/27/19  Yes Shade Flood, MD  ipratropium (ATROVENT) 0.06 % nasal spray Place 1 spray into both nostrils 4 (four) times daily as needed for rhinitis. As needed for nasal congestion 03/30/19  Yes Shade Flood, MD  meclizine (ANTIVERT) 25 MG tablet Take 1 tablet (25 mg total) by mouth 3 (three) times daily as needed for dizziness. 01/27/19  Yes Shade Flood, MD  Multiple Vitamin (MULTIVITAMIN) tablet Take 1 tablet by mouth daily.   Yes [provider]  ondansetron (ZOFRAN) 4 MG tablet Take 1 tablet (4 mg total) by mouth every 8 (eight) hours as needed for nausea or vomiting. 01/27/19  Yes Shade Flood, MD  pantoprazole (PROTONIX) 40 MG tablet Take 1 tablet (40 mg total) by mouth daily as needed. 01/27/19  Yes Shade Flood, MD  rizatriptan (MAXALT-MLT) 5 MG disintegrating tablet DISSOLVE ONE TABLET BY MOUTH BY MOUTH AS NEEDED FOR MIGRAINE. MAY REPEAT IN 2 HOURS AS NEEDED. MAXIMUM OF 6 TABLETS IN 24 HOURS 01/27/19  Yes Shade Flood, MD  tizanidine (ZANAFLEX) 2 MG capsule TAKE 1 CAPSULE(2 MG) BY MOUTH EVERY 6 HOURS AS NEEDED FOR MUSCLE SPASMS 01/27/19  Yes Shade Flood, MD   Social History   Socioeconomic History  . Marital status: Single    Spouse name: Not on file  . Number of children: Not on file  . Years of education: Not on file  . Highest education level: Not on file  Occupational History  . Not on file  Tobacco Use  . Smoking status: Never Smoker  . Smokeless tobacco: Never Used  Substance and Sexual Activity  . Alcohol use: No    Alcohol/week: 0.0 standard drinks  . Drug use: No  . Sexual activity: Not on file  Other Topics Concern  .  Not on file  Social History Narrative  . Not on file   Social Determinants of Health   Financial Resource Strain:   . Difficulty of Paying Living Expenses: Not on file  Food Insecurity:   . Worried About Programme researcher, broadcasting/film/video in the Last Year: Not on file  . Ran Out of Food in the Last Year: Not on file  Transportation Needs:   . Lack of Transportation (Medical): Not on file  . Lack of Transportation (Non-Medical): Not on file  Physical Activity:   . Days of Exercise per Week: Not on file  . Minutes of Exercise per Session: Not on file  Stress:   . Feeling of Stress : Not on file  Social Connections:   . Frequency of Communication with Friends and Family: Not on file  . Frequency of Social Gatherings with Friends and Family:  Not on file  . Attends Religious Services: Not on file  . Active Member of Clubs or Organizations: Not on file  . Attends Banker Meetings: Not on file  . Marital Status: Not on file  Intimate Partner Violence:   . Fear of Current or Ex-Partner: Not on file  . Emotionally Abused: Not on file  . Physically Abused: Not on file  . Sexually Abused: Not on file    Observations/Objective: Vitals:   04/27/19 0835  BP: 107/71  Pulse: 66  SpO2: 100%  Weight: 182 lb (82.6 kg)  Height: 5\' 5"  (1.651 m)  No distress over video.  Verbal responses.  Nontoxic-appearing.   Assessment and Plan: Neuropathy - Plan: gabapentin (NEURONTIN) 300 MG capsule  Palpitations Persistent neuropathic symptoms.  We will try higher dose of gabapentin 600 mg nightly.  Option of 100 mg during the day if symptomatic.  Recheck 4 weeks .  Has planned follow-up with cardiology this week for intermittent palpitations, heart rate variability noted on home sleep testing.  Could consider monitored testing as above to evaluate sleep architecture, but plans to discuss with cardiology first.  Follow Up Instructions: 4 weeks.    I discussed the assessment and treatment plan  with the patient. The patient was provided an opportunity to ask questions and all were answered. The patient agreed with the plan and demonstrated an understanding of the instructions.   The patient was advised to call back or seek an in-person evaluation if the symptoms worsen or if the condition fails to improve as anticipated.  I provided 6 minutes of non-face-to-face time during this encounter, 2 min chart review.    , MD

## 2019-04-28 ENCOUNTER — Telehealth: Payer: BC Managed Care – PPO | Admitting: Family Medicine

## 2019-04-28 ENCOUNTER — Encounter: Payer: Self-pay | Admitting: Family Medicine

## 2019-04-29 DIAGNOSIS — R001 Bradycardia, unspecified: Secondary | ICD-10-CM | POA: Diagnosis not present

## 2019-04-29 DIAGNOSIS — R000 Tachycardia, unspecified: Secondary | ICD-10-CM | POA: Diagnosis not present

## 2019-05-17 ENCOUNTER — Encounter: Payer: Self-pay | Admitting: Neurology

## 2019-05-17 DIAGNOSIS — I639 Cerebral infarction, unspecified: Secondary | ICD-10-CM

## 2019-05-17 DIAGNOSIS — G43619 Persistent migraine aura with cerebral infarction, intractable, without status migrainosus: Secondary | ICD-10-CM

## 2019-05-17 DIAGNOSIS — R001 Bradycardia, unspecified: Secondary | ICD-10-CM

## 2019-05-17 DIAGNOSIS — Z9884 Bariatric surgery status: Secondary | ICD-10-CM

## 2019-05-17 DIAGNOSIS — R519 Headache, unspecified: Secondary | ICD-10-CM

## 2019-05-17 DIAGNOSIS — G4719 Other hypersomnia: Secondary | ICD-10-CM

## 2019-05-17 DIAGNOSIS — K911 Postgastric surgery syndromes: Secondary | ICD-10-CM

## 2019-05-17 DIAGNOSIS — K219 Gastro-esophageal reflux disease without esophagitis: Secondary | ICD-10-CM

## 2019-05-17 NOTE — Telephone Encounter (Signed)
Changed to attended sleep study, related to cardiology request . CD

## 2019-06-08 ENCOUNTER — Other Ambulatory Visit: Payer: Self-pay

## 2019-06-08 ENCOUNTER — Ambulatory Visit (INDEPENDENT_AMBULATORY_CARE_PROVIDER_SITE_OTHER): Payer: BC Managed Care – PPO | Admitting: Neurology

## 2019-06-08 DIAGNOSIS — G4710 Hypersomnia, unspecified: Secondary | ICD-10-CM

## 2019-06-08 DIAGNOSIS — K911 Postgastric surgery syndromes: Secondary | ICD-10-CM

## 2019-06-08 DIAGNOSIS — I639 Cerebral infarction, unspecified: Secondary | ICD-10-CM

## 2019-06-08 DIAGNOSIS — R001 Bradycardia, unspecified: Secondary | ICD-10-CM

## 2019-06-08 DIAGNOSIS — G4719 Other hypersomnia: Secondary | ICD-10-CM

## 2019-06-08 DIAGNOSIS — R519 Headache, unspecified: Secondary | ICD-10-CM

## 2019-06-08 DIAGNOSIS — G43619 Persistent migraine aura with cerebral infarction, intractable, without status migrainosus: Secondary | ICD-10-CM

## 2019-06-08 DIAGNOSIS — K219 Gastro-esophageal reflux disease without esophagitis: Secondary | ICD-10-CM

## 2019-06-08 DIAGNOSIS — Z9884 Bariatric surgery status: Secondary | ICD-10-CM

## 2019-06-10 ENCOUNTER — Other Ambulatory Visit: Payer: Self-pay | Admitting: Family Medicine

## 2019-06-10 DIAGNOSIS — G43809 Other migraine, not intractable, without status migrainosus: Secondary | ICD-10-CM

## 2019-06-14 ENCOUNTER — Encounter: Payer: Self-pay | Admitting: Family Medicine

## 2019-06-15 ENCOUNTER — Other Ambulatory Visit: Payer: Self-pay | Admitting: Registered Nurse

## 2019-06-15 DIAGNOSIS — G43809 Other migraine, not intractable, without status migrainosus: Secondary | ICD-10-CM

## 2019-06-15 MED ORDER — ONDANSETRON HCL 4 MG PO TABS: 4.0000 mg | ORAL_TABLET | Freq: Three times a day (TID) | ORAL | 0 refills | Status: AC | PRN

## 2019-06-15 NOTE — Telephone Encounter (Signed)
Patient is requesting a refill of the following medications: Zofran Requested Prescriptions    No prescriptions requested or ordered in this encounter    Date of patient request: 06/15/2019 Last office visit: 03/30/2019 Date of last refill: 01/27/2019 Last refill amount: 20 tablets    Jackie Ellis pt original request denied-unsure why

## 2019-06-20 DIAGNOSIS — R001 Bradycardia, unspecified: Secondary | ICD-10-CM | POA: Insufficient documentation

## 2019-06-20 NOTE — Progress Notes (Signed)
IMPRESSION: Sleep efficiency was reduced at 78.4 % with a total  sleep time of 380 minutes, almost 6.5 hours.    1. Obstructive Sleep Apnea (OSA)was only present during REM  sleep. The overall AHI was 3.5/h and will not suffice for apnea  intervention.  2. Fragmented sleep- during REM periods, arousals were related to  OSA, but the other NREM sleep arousals were spontaneous. There  was no physiological cause identified.    RECOMMENDATIONS: spontaneous arousals are the dominant cause of  sleep fragmentation. If the patient's arousals are related to  joint pain or discomfort please address with PCP.
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
## 2019-06-20 NOTE — Progress Notes (Signed)
IMPRESSION: Sleep efficiency was reduced at 78.4 % with a total  sleep time of 380 minutes, almost 6.5 hours.    1. Obstructive Sleep Apnea (OSA)was only present during REM  sleep. The overall AHI was 3.5/h and will not suffice for apnea  intervention.  2. Fragmented sleep- during REM periods, arousals were related to  OSA, but the other NREM sleep arousals were spontaneous. There  was no physiological cause identified.    RECOMMENDATIONS: spontaneous arousals are the dominant cause of  sleep fragmentation. If the patient's arousals are related to  joint pain or discomfort please address with PCP.

## 2019-06-21 ENCOUNTER — Encounter: Payer: Self-pay | Admitting: Neurology

## 2019-06-24 ENCOUNTER — Ambulatory Visit: Payer: Self-pay | Attending: Internal Medicine

## 2019-06-24 DIAGNOSIS — Z23 Encounter for immunization: Secondary | ICD-10-CM

## 2019-06-24 NOTE — Progress Notes (Signed)
   Covid-19 Vaccination Clinic  Name:  Nolie Bignell    MRN: [id number] DOB: 10/15/1965  06/24/2019  Ms. Dimaano was observed post Covid-19 immunization for 15 minutes without incident. She was provided with Vaccine Information Sheet and instruction to access the V-Safe system.   Ms. Montesano was instructed to call 911 with any severe reactions post vaccine: Marland Kitchen Difficulty breathing  . Swelling of face and throat  . A fast heartbeat  . A bad rash all over body  . Dizziness and weakness   Immunizations Administered    Name Date Dose VIS Date Route   Pfizer COVID-19 Vaccine 06/24/2019  8:37 AM 0.3 mL 02/25/2019 Intramuscular   Manufacturer: [employer], [employer]   Lot: [id number]   NDC: [address]-3

## 2019-07-11 ENCOUNTER — Telehealth (INDEPENDENT_AMBULATORY_CARE_PROVIDER_SITE_OTHER): Payer: BC Managed Care – PPO | Admitting: Registered Nurse

## 2019-07-11 ENCOUNTER — Other Ambulatory Visit: Payer: Self-pay

## 2019-07-11 ENCOUNTER — Encounter: Payer: Self-pay | Admitting: Registered Nurse

## 2019-07-11 VITALS — HR 121 | Wt 183.0 lb

## 2019-07-11 DIAGNOSIS — J0110 Acute frontal sinusitis, unspecified: Secondary | ICD-10-CM | POA: Diagnosis not present

## 2019-07-11 MED ORDER — DM-GUAIFENESIN ER 30-600 MG PO TB12: 1.0000 | ORAL_TABLET | Freq: Two times a day (BID) | ORAL | 0 refills | Status: AC

## 2019-07-11 MED ORDER — AZITHROMYCIN 250 MG PO TABS: ORAL_TABLET | ORAL | 0 refills | Status: DC

## 2019-07-11 MED ORDER — FLUTICASONE PROPIONATE 50 MCG/ACT NA SUSP: 2.0000 | Freq: Every day | NASAL | 6 refills | Status: AC

## 2019-07-11 NOTE — Progress Notes (Signed)
Telemedicine Encounter- SOAP NOTE Established Patient  This telephone encounter was conducted with the patient's (or proxy's) verbal consent via audio telecommunications: yes  Patient was instructed to have this encounter in a suitably private space; and to only have persons present to whom they give permission to participate. In addition, patient identity was confirmed by use of name plus two identifiers (DOB and address).  I discussed the limitations, risks, security and privacy concerns of performing an evaluation and management service by telephone and the availability of in person appointments. I also discussed with the patient that there may be a patient responsible charge related to this service. The patient expressed understanding and agreed to proceed.  I spent a total of 13 minutes talking with the patient or their proxy.  Chief Complaint  Patient presents with  . Sore Throat    patient states she has pressure behind her eyes, body aches, coughing up yellowish-green mucus. Per patient she has took many OTC medications and still no relief she thinks she has a sinus infection because of the pressure.    Subjective   Jackie Ellis is a 54 y.o. established patient. Telephone visit today for sore throat.  HPI Has been having severe seasonal allergy symptoms - pressure behind eyes, sinus pressure, body aches, coughing up yellowish-green mucus.  Has been taking all OTCs available with no relief Symptoms been worsening for about a week. Has had a hx of sinus infections - usually only around once a year or every other year.   Patient Active Problem List   Diagnosis Date Noted  . Bradycardia 06/20/2019  . Sleep-related headache 03/24/2019  . GERD with apnea 03/24/2019  . Snoring 03/24/2019  . Insomnia disorder, with other sleep disorder, recurrent 03/24/2019  . Nocturia more than twice per night 03/24/2019  . Excessive daytime sleepiness 03/24/2019  . Abdominal pain, acute,  generalized 03/05/2016  . 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 Acid reflux 10/05/2015  . Gastroesophageal reflux disease with esophagitis 09/19/2015  . Migraine 07/31/2015  . Morbid obesity ([place]) 07/31/2015  . Family history of breast cancer in sister 07/21/2015  . 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 Status post gastric bypass for obesity 07/18/2015  . Knee osteoarthritis 07/18/2015  . Metatarsal stress fracture 03/08/2012  . KIDNEY DISEASE 02/25/2008  . FLANK PAIN, LEFT 02/25/2008  . ALLERGIC RHINITIS 01/26/2008    Past Medical History:  Diagnosis Date  . Allergy   . GERD (gastroesophageal reflux disease)     Current Outpatient Medications  Medication Sig Dispense Refill  . cetirizine (ZYRTEC) 10 MG tablet Take 1 tablet (10 mg total) by mouth daily. 30 tablet 11  . clonazePAM (KLONOPIN) 1 MG tablet Take 1 tablet (1 mg total) by mouth at bedtime as needed for anxiety. 30 tablet 2  . diclofenac sodium (VOLTAREN) 1 % GEL Apply 4 g topically 4 (four) times daily. 100 g 1  . EPINEPHrine (EPIPEN 2-PAK) 0.3 mg/0.3 mL IJ SOAJ injection Inject 0.3 mLs (0.3 mg total) into the muscle as needed for anaphylaxis. 1 each 0  . FLUoxetine (PROZAC) 20 MG tablet Take 2 tablets (40 mg total) by mouth daily. 180 tablet 1  . gabapentin (NEURONTIN) 100 MG capsule Take 1 capsule (100 mg total) by mouth at bedtime. 90 capsule 0  . gabapentin (NEURONTIN) 300 MG capsule Take 2 capsules (600 mg total) by mouth at bedtime. 180 capsule 1  . ipratropium (ATROVENT) 0.06 % nasal spray Place 1 spray  into both nostrils 4 (four) times daily as needed for rhinitis. As needed for nasal congestion 15 mL 5  . meclizine (ANTIVERT) 25 MG tablet Take 1 tablet (25 mg total) by mouth 3 (three) times daily as needed for dizziness. 30 tablet 0  . Multiple Vitamin (MULTIVITAMIN) tablet Take 1 tablet by mouth daily.    . ondansetron (ZOFRAN) 4 MG tablet Take 1 tablet (4 mg total) by mouth  every 8 (eight) hours as needed for nausea or vomiting. 20 tablet 0  . pantoprazole (PROTONIX) 40 MG tablet Take 1 tablet (40 mg total) by mouth daily as needed. 90 tablet 3  . rizatriptan (MAXALT-MLT) 5 MG disintegrating tablet DISSOLVE ONE TABLET BY MOUTH BY MOUTH AS NEEDED FOR MIGRAINE. MAY REPEAT IN 2 HOURS AS NEEDED. MAXIMUM OF 6 TABLETS IN 24 HOURS 10 tablet 2  . tizanidine (ZANAFLEX) 2 MG capsule TAKE 1 CAPSULE(2 MG) BY MOUTH EVERY 6 HOURS AS NEEDED FOR MUSCLE SPASMS 30 capsule 2  . azithromycin (ZITHROMAX) 250 MG tablet Take 2 tablets on first day. Then take 1 daily. Finish entire supply. 6 tablet 0  . dextromethorphan-guaiFENesin (MUCINEX DM) 30-600 MG 12hr tablet Take 1 tablet by mouth 2 (two) times daily. 20 tablet 0  . fluticasone (FLONASE) 50 MCG/ACT nasal spray Place 2 sprays into both nostrils daily. 16 g 6   No current facility-administered medications for this visit.    Allergies  Allergen Reactions  . Latex Hives, Itching and Swelling  . Shellfish Allergy Anaphylaxis  . Aspirin   . Nitrofurantoin     REACTION: rash on face  . Other Itching    Multiple fruits and certain seeds -itching and rash  . Penicillins   . Sulfa Antibiotics Other (See Comments)    blisters  . Sulfonamide Derivatives   . Codeine Rash  . Penicillins Rash    Social History   Socioeconomic History  . Marital status: Single    Spouse name: Not on file  . Number of children: Not on file  . Years of education: Not on file  . Highest education level: Not on file  Occupational History  . Not on file  Tobacco Use  . Smoking status: Never Smoker  . Smokeless tobacco: Never Used  Substance and Sexual Activity  . Alcohol use: No    Alcohol/week: 0.0 standard drinks  . Drug use: No  . Sexual activity: Not on file  Other Topics Concern  . Not on file  Social History Narrative  . Not on file   Social Determinants of Health   Financial Resource Strain:   . Difficulty of Paying Living  Expenses:   Food Insecurity:   . Worried About Programme researcher, broadcasting/film/video in the Last Year:   . Barista in the Last Year:   Transportation Needs:   . Freight forwarder (Medical):   Marland Kitchen Lack of Transportation (Non-Medical):   Physical Activity:   . Days of Exercise per Week:   . Minutes of Exercise per Session:   Stress:   . Feeling of Stress :   Social Connections:   . Frequency of Communication with Friends and Family:   . Frequency of Social Gatherings with Friends and Family:   . Attends Religious Services:   . Active Member of Clubs or Organizations:   . Attends Banker Meetings:   Marland Kitchen Marital Status:   Intimate Partner Violence:   . Fear of Current or Ex-Partner:   . Emotionally Abused:   .  Physically Abused:   . Sexually Abused:     ROS Per hpi   Objective   Vitals as reported by the patient: Today's Vitals   07/11/19 1138  Pulse: (!) 121  Weight: 183 lb (83 kg)    [place] was seen today for sore throat.  Diagnoses and all orders for this visit:  Acute frontal sinusitis, recurrence not specified -     azithromycin (ZITHROMAX) 250 MG tablet; Take 2 tablets on first day. Then take 1 daily. Finish entire supply. -     fluticasone (FLONASE) 50 MCG/ACT nasal spray; Place 2 sprays into both nostrils daily. -     dextromethorphan-guaiFENesin (MUCINEX DM) 30-600 MG 12hr tablet; Take 1 tablet by mouth 2 (two) times daily.   PLAN  z pack, flonase, mucinex dm  Return precautions given  Continue OTCs   Patient encouraged to call clinic with any questions, comments, or concerns.  I discussed the assessment and treatment plan with the patient. The patient was provided an opportunity to ask questions and all were answered. The patient agreed with the plan and demonstrated an understanding of the instructions.   The patient was advised to call back or seek an in-person evaluation if the symptoms worsen or if the condition fails to improve as  anticipated.  I provided 13 minutes of non-face-to-face time during this encounter.  Janeece Agee, NP  Primary Care at [facility]

## 2019-07-11 NOTE — Patient Instructions (Signed)
° ° ° °  If you have lab work done today you will be contacted with your lab results within the next 2 weeks.  If you have not heard from us then please contact us. The fastest way to get your results is to register for My Chart. ° ° °IF you received an x-ray today, you will receive an invoice from [facility]. Please contact [facility] at [phone] with questions or concerns regarding your invoice.  ° °IF you received labwork today, you will receive an invoice from [facility]. Please contact [facility] at [phone] with questions or concerns regarding your invoice.  ° °Our billing staff will not be able to assist you with questions regarding bills from these companies. ° °You will be contacted with the lab results as soon as they are available. The fastest way to get your results is to activate your My Chart account. Instructions are located on the last page of this paperwork. If you have not heard from us regarding the results in 2 weeks, please contact this office. °  ° ° ° °

## 2019-07-15 DIAGNOSIS — R790 Abnormal level of blood mineral: Secondary | ICD-10-CM | POA: Diagnosis not present

## 2019-07-15 DIAGNOSIS — R7989 Other specified abnormal findings of blood chemistry: Secondary | ICD-10-CM | POA: Diagnosis not present

## 2019-07-15 DIAGNOSIS — R635 Abnormal weight gain: Secondary | ICD-10-CM | POA: Diagnosis not present

## 2019-07-15 DIAGNOSIS — N951 Menopausal and female climacteric states: Secondary | ICD-10-CM | POA: Diagnosis not present

## 2019-07-18 ENCOUNTER — Ambulatory Visit: Payer: BC Managed Care – PPO | Attending: Internal Medicine

## 2019-07-18 DIAGNOSIS — Z23 Encounter for immunization: Secondary | ICD-10-CM

## 2019-07-18 NOTE — Progress Notes (Signed)
   Covid-19 Vaccination Clinic  Name:  Jackie Ellis    MRN: [id number] DOB: 1965/06/17  07/18/2019  [name] was observed post Covid-19 immunization for 30 without incident. She was provided with Vaccine Information Sheet and instruction to access the V-Safe system.   [name] was instructed to call 911 with any severe reactions post vaccine: Marland Kitchen Difficulty breathing  . Swelling of face and throat  . A fast heartbeat  . A bad rash all over body  . Dizziness and weakness   Immunizations Administered    Name Date Dose VIS Date Route   Pfizer COVID-19 Vaccine 07/18/2019  9:51 AM 0.3 mL 05/11/2018 Intramuscular   Manufacturer: [employer], [employer]   Lot: [id number]   NDC: [address]-1

## 2019-07-21 ENCOUNTER — Other Ambulatory Visit: Payer: Self-pay

## 2019-07-21 ENCOUNTER — Ambulatory Visit (HOSPITAL_COMMUNITY)
Admission: EM | Admit: 2019-07-21 | Discharge: 2019-07-21 | Disposition: A | Discharge status: Home / Self Care | Payer: BC Managed Care – PPO | Attending: Family Medicine | Admitting: Family Medicine

## 2019-07-21 ENCOUNTER — Encounter (HOSPITAL_COMMUNITY): Payer: Self-pay

## 2019-07-21 DIAGNOSIS — L249 Irritant contact dermatitis, unspecified cause: Secondary | ICD-10-CM

## 2019-07-21 DIAGNOSIS — T7840XA Allergy, unspecified, initial encounter: Secondary | ICD-10-CM

## 2019-07-21 MED ORDER — METHYLPREDNISOLONE SODIUM SUCC 125 MG IJ SOLR
125.0000 mg | Freq: Once | INTRAMUSCULAR | Status: AC
  Administered 2019-07-21: 125 mg via INTRAMUSCULAR

## 2019-07-21 MED ORDER — TRIAMCINOLONE ACETONIDE 0.1 % EX CREA: 1.0000 "application " | TOPICAL_CREAM | Freq: Two times a day (BID) | CUTANEOUS | 0 refills | Status: AC

## 2019-07-21 MED ORDER — PREDNISONE 20 MG PO TABS
ORAL_TABLET | ORAL | 0 refills | Status: DC
Start: 2019-07-21 — End: 2019-10-03

## 2019-07-21 MED ORDER — METHYLPREDNISOLONE SODIUM SUCC 125 MG IJ SOLR
INTRAMUSCULAR | Status: AC
  Filled 2019-07-21: qty 2

## 2019-07-21 NOTE — ED Provider Notes (Signed)
[facility]    CSN: [id number] Arrival date & time: 07/21/19  1406      History   Chief Complaint Chief Complaint  Patient presents with  . Rash    HPI Jackie Ellis is a 54 y.o. female.   HPI  Patient presents for evaluation of a generalized rash following receiving 2/2 of COVID vaccine 3 days ago. She reports a history if of sensitivity to medications, shellfish, and latex. Denies any symptoms of nausea, vomiting, chills, fever, SOB or dysphagia. She complains of persistent itching neck, torso, and extremities.  Past Medical History:  Diagnosis Date  . Allergy   . GERD (gastroesophageal reflux disease)     Patient Active Problem List   Diagnosis Date Noted  . Bradycardia 06/20/2019  . Sleep-related headache 03/24/2019  . GERD with apnea 03/24/2019  . Snoring 03/24/2019  . Insomnia disorder, with other sleep disorder, recurrent 03/24/2019  . Nocturia more than twice per night 03/24/2019  . Excessive daytime sleepiness 03/24/2019  . Abdominal pain, acute, generalized 03/05/2016  . 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 Acid reflux 10/05/2015  . Gastroesophageal reflux disease with esophagitis 09/19/2015  . Migraine 07/31/2015  . Morbid obesity ([place]) 07/31/2015  . Family history of breast cancer in sister 07/21/2015  . 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 Status post gastric bypass for obesity 07/18/2015  . Knee osteoarthritis 07/18/2015  . Metatarsal stress fracture 03/08/2012  . KIDNEY DISEASE 02/25/2008  . FLANK PAIN, LEFT 02/25/2008  . ALLERGIC RHINITIS 01/26/2008    Past Surgical History:  Procedure Laterality Date  . ABDOMINAL HYSTERECTOMY    . GASTRECTOMY    . TUBAL LIGATION    . TUBAL LIGATION      OB History   No obstetric history on file.      Home Medications    Prior to Admission medications   Medication Sig Start Date End Date Taking? Authorizing Provider  azithromycin (ZITHROMAX) 250 MG  tablet Take 2 tablets on first day. Then take 1 daily. Finish entire supply. 07/11/19   Maximiano Coss, NP  cetirizine (ZYRTEC) 10 MG tablet Take 1 tablet (10 mg total) by mouth daily. 09/03/18   Forrest Moron, MD  clonazePAM (KLONOPIN) 1 MG tablet Take 1 tablet (1 mg total) by mouth at bedtime as needed for anxiety. 01/27/19   Wendie Agreste, MD  dextromethorphan-guaiFENesin [facility] DM) 30-600 MG 12hr tablet Take 1 tablet by mouth 2 (two) times daily. 07/11/19   Maximiano Coss, NP  diclofenac sodium (VOLTAREN) 1 % GEL Apply 4 g topically 4 (four) times daily. 10/07/17   Forrest Moron, MD  EPINEPHrine (EPIPEN 2-PAK) 0.3 mg/0.3 mL IJ SOAJ injection Inject 0.3 mLs (0.3 mg total) into the muscle as needed for anaphylaxis. 02/16/19   Wendie Agreste, MD  FLUoxetine (PROZAC) 20 MG tablet Take 2 tablets (40 mg total) by mouth daily. 01/31/19   Wendie Agreste, MD  fluticasone (FLONASE) 50 MCG/ACT nasal spray Place 2 sprays into both nostrils daily. 07/11/19   Maximiano Coss, NP  gabapentin (NEURONTIN) 100 MG capsule Take 1 capsule (100 mg total) by mouth at bedtime. 03/30/19   Wendie Agreste, MD  gabapentin (NEURONTIN) 300 MG capsule Take 2 capsules (600 mg total) by mouth at bedtime. 04/27/19   Wendie Agreste, MD  ipratropium (ATROVENT) 0.06 % nasal spray Place 1 spray into both nostrils 4 (four) times daily as needed for rhinitis. As needed for nasal congestion 03/30/19  Shade Flood, MD  meclizine (ANTIVERT) 25 MG tablet Take 1 tablet (25 mg total) by mouth 3 (three) times daily as needed for dizziness. 01/27/19   Shade Flood, MD  Multiple Vitamin (MULTIVITAMIN) tablet Take 1 tablet by mouth daily.    [provider]  ondansetron (ZOFRAN) 4 MG tablet Take 1 tablet (4 mg total) by mouth every 8 (eight) hours as needed for nausea or vomiting. 06/15/19   Janeece Agee, NP  pantoprazole (PROTONIX) 40 MG tablet Take 1 tablet (40 mg total) by mouth daily as needed. 01/27/19    Shade Flood, MD  rizatriptan (MAXALT-MLT) 5 MG disintegrating tablet DISSOLVE ONE TABLET BY MOUTH BY MOUTH AS NEEDED FOR MIGRAINE. MAY REPEAT IN 2 HOURS AS NEEDED. MAXIMUM OF 6 TABLETS IN 24 HOURS 01/27/19   Shade Flood, MD  tizanidine (ZANAFLEX) 2 MG capsule TAKE 1 CAPSULE(2 MG) BY MOUTH EVERY 6 HOURS AS NEEDED FOR MUSCLE SPASMS 01/27/19   Shade Flood, MD    Family History Family History  Problem Relation Age of Onset  . Diabetes Mother   . Hypertension Mother   . Diabetes Father   . Hypertension Father   . Cancer Sister   . Diabetes Brother   . Hypertension Brother   . Hyperlipidemia Brother     Social History Social History   Tobacco Use  . Smoking status: Never Smoker  . Smokeless tobacco: Never Used  Substance Use Topics  . Alcohol use: No    Alcohol/week: 0.0 standard drinks  . Drug use: No     Allergies   Latex, Shellfish allergy, Aspirin, Nitrofurantoin, Other, Penicillins, Sulfa antibiotics, Sulfonamide derivatives, Codeine, and Penicillins   Review of Systems Review of Systems Pertinent negatives listed in HPI  Physical Exam Triage Vital Signs ED Triage Vitals  Enc Vitals Group     BP 07/21/19 1427 129/75     Pulse Rate 07/21/19 1427 69     Resp 07/21/19 1427 18     Temp 07/21/19 1427 98.3 F (36.8 C)     Temp Source 07/21/19 1427 Oral     SpO2 07/21/19 1427 100 %     Weight 07/21/19 1426 176 lb (79.8 kg)     Height --      Head Circumference --      Peak Flow --      Pain Score 07/21/19 1426 0     Pain Loc --      Pain Edu? --      Excl. in GC? --    No data found.  Updated Vital Signs BP 129/75 (BP Location: Right Arm)   Pulse 69   Temp 98.3 F (36.8 C) (Oral)   Resp 18   Wt 176 lb (79.8 kg)   SpO2 100%   BMI 29.29 kg/m   Visual Acuity Right Eye Distance:   Left Eye Distance:   Bilateral Distance:    Right Eye Near:   Left Eye Near:    Bilateral Near:     Physical Exam General appearance: alert, well  developed, well nourished, cooperative and in no distress Head: Normocephalic, without obvious abnormality, atraumatic Respiratory: Respirations even and unlabored, normal respiratory rate Heart: rate and rhythm normal. No gallop or murmurs noted on exam  Extremities: No gross deformities Skin: Maculopapular rash present  Psych: Appropriate mood and affect. Neurologic: Mental status: Alert, oriented to person, place, and time, thought content appropriate.  UC Treatments / Results  Labs (all labs ordered are  listed, but only abnormal results are displayed) Labs Reviewed - No data to display  EKG   Radiology No results found.  Procedures Procedures (including critical care time)  Medications Ordered in UC Medications - No data to display  Initial Impression / Assessment and Plan / UC Course  I have reviewed the triage vital signs and the nursing notes.  Pertinent labs & imaging results that were available during my care of the patient were reviewed by me and considered in my medical decision making (see chart for details).     Irritant contact dermatitis, unspecified trigger Allergic reaction, initial encounter -Solumedrol 125 mg IM administered here in clinic - Start prednisone taper tomorrow: Take Prednisone 20 mg,  in mornings with breakfast as follows:  Take 3 pills for 3 days, Take 2 pills for 3 days, and Take 1 pill for 3 days.  Complete all medication. -Apply triamcinolone cream to rash BID PRN -Continue benadryl -Red flags discussed Return for follow if symptoms worsen or do not improve.  Final Clinical Impressions(s) / UC Diagnoses   Final diagnoses:  Irritant contact dermatitis, unspecified trigger  Allergic reaction, initial encounter   Discharge Instructions   None    ED Prescriptions    Medication Sig Dispense Auth. Provider   predniSONE (DELTASONE) 20 MG tablet Take 3 PO QAM x3days, 2 PO QAM x3days, 1 PO QAM x3days 18 tablet Bing Neighbors, FNP    triamcinolone cream (KENALOG) 0.1 % Apply 1 application topically 2 (two) times daily. 454 g Bing Neighbors, FNP     PDMP not reviewed this encounter.   Bing Neighbors, [place] 07/21/19 [phone]

## 2019-07-21 NOTE — ED Triage Notes (Signed)
Pt is here with a red rash all over her body that started yesterday, pt has Benadryl taken to relieve discomfort.

## 2019-08-05 DIAGNOSIS — R635 Abnormal weight gain: Secondary | ICD-10-CM | POA: Diagnosis not present

## 2019-08-05 DIAGNOSIS — N951 Menopausal and female climacteric states: Secondary | ICD-10-CM | POA: Diagnosis not present

## 2019-08-05 DIAGNOSIS — R790 Abnormal level of blood mineral: Secondary | ICD-10-CM | POA: Diagnosis not present

## 2019-08-05 DIAGNOSIS — Z1331 Encounter for screening for depression: Secondary | ICD-10-CM | POA: Diagnosis not present

## 2019-08-05 DIAGNOSIS — Z1339 Encounter for screening examination for other mental health and behavioral disorders: Secondary | ICD-10-CM | POA: Diagnosis not present

## 2019-08-05 DIAGNOSIS — E7800 Pure hypercholesterolemia, unspecified: Secondary | ICD-10-CM | POA: Diagnosis not present

## 2019-08-24 DIAGNOSIS — M542 Cervicalgia: Secondary | ICD-10-CM | POA: Diagnosis not present

## 2019-09-12 DIAGNOSIS — M4726 Other spondylosis with radiculopathy, lumbar region: Secondary | ICD-10-CM | POA: Diagnosis not present

## 2019-09-12 DIAGNOSIS — M5116 Intervertebral disc disorders with radiculopathy, lumbar region: Secondary | ICD-10-CM | POA: Diagnosis not present

## 2019-09-12 DIAGNOSIS — M542 Cervicalgia: Secondary | ICD-10-CM | POA: Diagnosis not present

## 2019-09-12 DIAGNOSIS — M5441 Lumbago with sciatica, right side: Secondary | ICD-10-CM | POA: Diagnosis not present

## 2019-09-12 DIAGNOSIS — G629 Polyneuropathy, unspecified: Secondary | ICD-10-CM | POA: Diagnosis not present

## 2019-09-12 DIAGNOSIS — M50321 Other cervical disc degeneration at C4-C5 level: Secondary | ICD-10-CM | POA: Diagnosis not present

## 2019-09-12 DIAGNOSIS — G43709 Chronic migraine without aura, not intractable, without status migrainosus: Secondary | ICD-10-CM | POA: Diagnosis not present

## 2019-09-12 DIAGNOSIS — M5442 Lumbago with sciatica, left side: Secondary | ICD-10-CM | POA: Diagnosis not present

## 2019-09-12 DIAGNOSIS — G8929 Other chronic pain: Secondary | ICD-10-CM | POA: Diagnosis not present

## 2019-09-29 DIAGNOSIS — R922 Inconclusive mammogram: Secondary | ICD-10-CM | POA: Diagnosis not present

## 2019-09-29 DIAGNOSIS — Z1239 Encounter for other screening for malignant neoplasm of breast: Secondary | ICD-10-CM | POA: Diagnosis not present

## 2019-09-29 DIAGNOSIS — Z803 Family history of malignant neoplasm of breast: Secondary | ICD-10-CM | POA: Diagnosis not present

## 2019-10-03 ENCOUNTER — Ambulatory Visit (INDEPENDENT_AMBULATORY_CARE_PROVIDER_SITE_OTHER): Payer: BC Managed Care – PPO | Admitting: Dermatology

## 2019-10-03 ENCOUNTER — Other Ambulatory Visit: Payer: Self-pay

## 2019-10-03 DIAGNOSIS — L700 Acne vulgaris: Secondary | ICD-10-CM

## 2019-10-03 MED ORDER — TAZAROTENE 0.1 % EX FOAM: CUTANEOUS | 2 refills | Status: AC

## 2019-10-03 MED ORDER — MINOCYCLINE HCL 50 MG PO CAPS: 50.0000 mg | ORAL_CAPSULE | Freq: Two times a day (BID) | ORAL | 1 refills | Status: AC

## 2019-10-03 MED ORDER — FLUCONAZOLE 100 MG PO TABS: ORAL_TABLET | ORAL | 0 refills | Status: DC

## 2019-10-03 NOTE — Patient Instructions (Addendum)
Routine follow-up for Jackie Ellis.  When she stops the oral minocycline she still gets cystic acne lesions particularly around the jawline and chin.  The topical minocycline Amzeeq simply did not work as well.  I am okay with her using low-dose regular oral minocycline 50mg  for the next 6 months.  She is also being given a prescription for fluconazole 100 mg #10 which she could take at the first sign of recurrent vaginal yeast.  Finally we will add a different topical called Fabior foam; this may be more affordable as a cash pay than with her insurance and we will inform her about this.  I would only use the Fabior on areas prone to breakout 1 or 2 nights weekly because of the potential for irritation and exacerbation of her dryness.  Initial follow-up in 6 weeks through MyChart or by telephone.

## 2019-10-08 ENCOUNTER — Encounter: Payer: Self-pay | Admitting: Dermatology

## 2019-10-08 NOTE — Progress Notes (Signed)
   Follow-Up Visit   Subjective  Jackie Ellis is a 54 y.o. female who presents for the following: Acne (treatment is working well, takeing minocycline, if missed dose breakout on chin).  Acne Location: Face especially chin Duration:  Quality:  Associated Signs/Symptoms: Modifying Factors: Oral minocycline helpful. Severity:  Timing: Context:   Objective  Well appearing patient in no apparent distress; mood and affect are within normal limits.  A focused examination was performed including Head and neck.. Relevant physical exam findings are noted in the Assessment and Plan.   Assessment & Plan  Routine follow-up for Jackie Ellis.  When she stops the oral minocycline she still gets cystic acne lesions particularly around the jawline and chin.  The topical minocycline Amzeeq simply did not work as well.  I am okay with her using low-dose regular oral minocycline 50mg  for the next 6 months.  She is also being given a prescription for fluconazole 100 mg #10 which she could take at the first sign of recurrent vaginal yeast.  Finally we will add a different topical called Fabior foam; this may be more affordable as a cash pay than with her insurance and we will inform her about this.  I would only use the Fabior on areas prone to breakout 1 or 2 nights weekly because of the potential for irritation and exacerbation of her dryness.  Initial follow-up in 6 weeks through MyChart or by telephone.  Acne vulgaris (2) Right Lower Cutaneous Lip; Left Buccal Cheek   Okay low-dose oral minocycline for up to 6 months.  She knows to discontinue this if there are any side effects or if she needs to be on another antibiotic for any reason.  Prescription for fluconazole if she should develop recurring vaginal yeast.  Add Fabior foam both for better acne control and perhaps to help lighten the dark spots.     I, , MD, have reviewed all documentation for this visit.  The documentation on  10/08/19 for the exam, diagnosis, procedures, and orders are all accurate and complete.

## 2019-10-26 DIAGNOSIS — M5030 Other cervical disc degeneration, unspecified cervical region: Secondary | ICD-10-CM | POA: Diagnosis not present

## 2019-10-26 DIAGNOSIS — M4722 Other spondylosis with radiculopathy, cervical region: Secondary | ICD-10-CM | POA: Diagnosis not present

## 2019-10-26 DIAGNOSIS — M47812 Spondylosis without myelopathy or radiculopathy, cervical region: Secondary | ICD-10-CM | POA: Diagnosis not present

## 2019-10-26 DIAGNOSIS — M4802 Spinal stenosis, cervical region: Secondary | ICD-10-CM | POA: Diagnosis not present

## 2019-11-09 DIAGNOSIS — K219 Gastro-esophageal reflux disease without esophagitis: Secondary | ICD-10-CM | POA: Diagnosis not present

## 2019-11-09 DIAGNOSIS — Z9884 Bariatric surgery status: Secondary | ICD-10-CM | POA: Diagnosis not present

## 2019-11-09 DIAGNOSIS — K432 Incisional hernia without obstruction or gangrene: Secondary | ICD-10-CM | POA: Diagnosis not present

## 2019-11-09 DIAGNOSIS — M5030 Other cervical disc degeneration, unspecified cervical region: Secondary | ICD-10-CM | POA: Diagnosis not present

## 2019-11-09 DIAGNOSIS — M4722 Other spondylosis with radiculopathy, cervical region: Secondary | ICD-10-CM | POA: Diagnosis not present

## 2019-11-09 DIAGNOSIS — Z48815 Encounter for surgical aftercare following surgery on the digestive system: Secondary | ICD-10-CM | POA: Diagnosis not present

## 2019-11-09 DIAGNOSIS — Z9049 Acquired absence of other specified parts of digestive tract: Secondary | ICD-10-CM | POA: Diagnosis not present

## 2019-11-09 DIAGNOSIS — M4802 Spinal stenosis, cervical region: Secondary | ICD-10-CM | POA: Diagnosis not present

## 2019-11-18 DIAGNOSIS — E538 Deficiency of other specified B group vitamins: Secondary | ICD-10-CM | POA: Diagnosis not present

## 2019-12-12 ENCOUNTER — Other Ambulatory Visit: Payer: Self-pay | Admitting: Dermatology

## 2019-12-12 DIAGNOSIS — Z20828 Contact with and (suspected) exposure to other viral communicable diseases: Secondary | ICD-10-CM | POA: Diagnosis not present

## 2019-12-12 DIAGNOSIS — M47812 Spondylosis without myelopathy or radiculopathy, cervical region: Secondary | ICD-10-CM | POA: Diagnosis not present

## 2019-12-12 DIAGNOSIS — M4802 Spinal stenosis, cervical region: Secondary | ICD-10-CM | POA: Diagnosis not present

## 2019-12-12 DIAGNOSIS — M5030 Other cervical disc degeneration, unspecified cervical region: Secondary | ICD-10-CM | POA: Diagnosis not present

## 2019-12-12 NOTE — Telephone Encounter (Signed)
Called patient to see if still taking fluconazole

## 2019-12-19 DIAGNOSIS — E538 Deficiency of other specified B group vitamins: Secondary | ICD-10-CM | POA: Diagnosis not present

## 2020-01-13 DIAGNOSIS — K449 Diaphragmatic hernia without obstruction or gangrene: Secondary | ICD-10-CM | POA: Diagnosis not present

## 2020-01-13 DIAGNOSIS — R1013 Epigastric pain: Secondary | ICD-10-CM | POA: Diagnosis not present

## 2020-01-13 DIAGNOSIS — K30 Functional dyspepsia: Secondary | ICD-10-CM | POA: Diagnosis not present

## 2020-01-17 DIAGNOSIS — E538 Deficiency of other specified B group vitamins: Secondary | ICD-10-CM | POA: Diagnosis not present

## 2020-02-21 DIAGNOSIS — E538 Deficiency of other specified B group vitamins: Secondary | ICD-10-CM | POA: Diagnosis not present

## 2020-02-22 ENCOUNTER — Other Ambulatory Visit: Payer: Self-pay | Admitting: Family Medicine

## 2020-02-22 DIAGNOSIS — Z6832 Body mass index (BMI) 32.0-32.9, adult: Secondary | ICD-10-CM | POA: Diagnosis not present

## 2020-02-22 DIAGNOSIS — K219 Gastro-esophageal reflux disease without esophagitis: Secondary | ICD-10-CM

## 2020-02-22 DIAGNOSIS — Z9884 Bariatric surgery status: Secondary | ICD-10-CM | POA: Diagnosis not present

## 2020-02-22 NOTE — Telephone Encounter (Signed)
Requested medication (s) are due for refill today: Yes  Requested medication (s) are on the active medication list: Yes  Last refill:  01/27/19  Future visit scheduled: No  Notes to clinic:  Unable to refill per protocol, expired Rx     Requested Prescriptions  Pending Prescriptions Disp Refills   pantoprazole (PROTONIX) 40 MG tablet [Pharmacy Med Name: Pantoprazole Sodium 40 MG Oral Tablet Delayed Release] 30 tablet 0    Sig: TAKE 1 TABLET BY MOUTH ONCE DAILY AS NEEDED      Gastroenterology: Proton Pump Inhibitors Passed - 02/22/2020 10:41 AM      Passed - Valid encounter within last 12 months    Recent Outpatient Visits           7 months ago Acute frontal sinusitis, recurrence not specified   [facility], Gerlene Burdock, NP   10 months ago Neuropathy   [facility], Asencion Partridge, MD   10 months ago Postresectional malabsorption syndrome   [facility], Asencion Partridge, MD   1 year ago Shellfish allergy   [facility], Asencion Partridge, MD   1 year ago Snoring   [facility], Asencion Partridge, MD

## 2020-03-02 DIAGNOSIS — Z1239 Encounter for other screening for malignant neoplasm of breast: Secondary | ICD-10-CM | POA: Diagnosis not present

## 2020-03-02 DIAGNOSIS — Z1231 Encounter for screening mammogram for malignant neoplasm of breast: Secondary | ICD-10-CM | POA: Diagnosis not present

## 2020-03-02 LAB — HM MAMMOGRAPHY

## 2020-03-05 ENCOUNTER — Other Ambulatory Visit: Payer: Self-pay | Admitting: Dermatology

## 2020-03-15 DIAGNOSIS — E538 Deficiency of other specified B group vitamins: Secondary | ICD-10-CM | POA: Diagnosis not present

## 2020-03-15 DIAGNOSIS — Z0000 Encounter for general adult medical examination without abnormal findings: Secondary | ICD-10-CM | POA: Diagnosis not present

## 2020-03-15 DIAGNOSIS — Z13220 Encounter for screening for lipoid disorders: Secondary | ICD-10-CM | POA: Diagnosis not present

## 2020-03-15 DIAGNOSIS — Z113 Encounter for screening for infections with a predominantly sexual mode of transmission: Secondary | ICD-10-CM | POA: Diagnosis not present

## 2020-03-15 DIAGNOSIS — G43709 Chronic migraine without aura, not intractable, without status migrainosus: Secondary | ICD-10-CM | POA: Diagnosis not present

## 2020-03-15 DIAGNOSIS — K219 Gastro-esophageal reflux disease without esophagitis: Secondary | ICD-10-CM | POA: Diagnosis not present

## 2020-03-15 DIAGNOSIS — E559 Vitamin D deficiency, unspecified: Secondary | ICD-10-CM | POA: Diagnosis not present

## 2020-04-08 ENCOUNTER — Encounter (HOSPITAL_BASED_OUTPATIENT_CLINIC_OR_DEPARTMENT_OTHER): Payer: Self-pay

## 2020-04-08 ENCOUNTER — Other Ambulatory Visit: Payer: Self-pay

## 2020-04-08 ENCOUNTER — Emergency Department (HOSPITAL_BASED_OUTPATIENT_CLINIC_OR_DEPARTMENT_OTHER)
Admission: EM | Admit: 2020-04-08 | Discharge: 2020-04-08 | Disposition: A | Discharge status: Home / Self Care | Payer: BC Managed Care – PPO | Attending: Emergency Medicine | Admitting: Emergency Medicine

## 2020-04-08 DIAGNOSIS — Z91040 Latex allergy status: Secondary | ICD-10-CM | POA: Insufficient documentation

## 2020-04-08 DIAGNOSIS — Z20822 Contact with and (suspected) exposure to covid-19: Secondary | ICD-10-CM | POA: Insufficient documentation

## 2020-04-08 DIAGNOSIS — M545 Low back pain, unspecified: Secondary | ICD-10-CM

## 2020-04-08 DIAGNOSIS — M5459 Other low back pain: Secondary | ICD-10-CM | POA: Diagnosis not present

## 2020-04-08 LAB — URINALYSIS, ROUTINE W REFLEX MICROSCOPIC
Glucose, UA: NEGATIVE mg/dL
Hgb urine dipstick: NEGATIVE
Ketones, ur: NEGATIVE mg/dL
Leukocytes,Ua: NEGATIVE
Nitrite: NEGATIVE
Protein, ur: NEGATIVE mg/dL
Specific Gravity, Urine: 1.015 (ref 1.005–1.030)
pH: 6.5 (ref 5.0–8.0)

## 2020-04-08 MED ORDER — OXYCODONE HCL 5 MG PO TABS
5.0000 mg | ORAL_TABLET | Freq: Four times a day (QID) | ORAL | 0 refills | Status: AC | PRN
Start: 2020-04-08 — End: ?

## 2020-04-08 MED ORDER — DEXAMETHASONE SODIUM PHOSPHATE 10 MG/ML IJ SOLN
8.0000 mg | Freq: Once | INTRAMUSCULAR | Status: AC
  Administered 2020-04-08: 8 mg via INTRAMUSCULAR
  Filled 2020-04-08: qty 1

## 2020-04-08 NOTE — ED Provider Notes (Signed)
[facility] EMERGENCY DEPARTMENT Provider Note   CSN: [id number] Arrival date & time: 04/08/20  1053     History Chief Complaint  Patient presents with  . Flank Pain    Jackie Ellis is a 55 y.o. female w PMHx GERD, cholecystectomy, hysterectomy, gastric bypass, presenting to the ED with complaint of bilateral low back pain that began Friday. Patient states pain is worse with movement. It is low back bilaterally radiating into b/l buttock. States it feels muscular. She has been treating symptoms with gabapentin, tylenol, tizanidine, with minimal relief. She sits at a desk for work. No recent injury or strenuous activity to cause injury.  She denies urinary symptoms, abdominal pain, N/V, fever, numbness/weakness in extremities, radiating pain to legs. She is not having difficulty walking. She has hx of nephrolithiasis, though symptoms do not feel similar.   Did had potential exposure to COVID last week, though is not having any other symptoms. She is vaccinated and boosted against COVID.   The history is provided by the patient.       Past Medical History:  Diagnosis Date  . Allergy   . GERD (gastroesophageal reflux disease)     Patient Active Problem List   Diagnosis Date Noted  . Bradycardia 06/20/2019  . Sleep-related headache 03/24/2019  . GERD with apnea 03/24/2019  . Snoring 03/24/2019  . Insomnia disorder, with other sleep disorder, recurrent 03/24/2019  . Nocturia more than twice per night 03/24/2019  . Excessive daytime sleepiness 03/24/2019  . Primary generalized (osteo)arthritis 01/22/2019  . Umbilical hernia without obstruction and without gangrene 10/04/2018  . S/P gastric bypass 10/04/2018  . Genetic testing 10/14/2016  . Abdominal pain, acute, generalized 03/05/2016  . 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 Acid reflux 10/05/2015  . Gastroesophageal reflux disease with esophagitis 09/19/2015  . Migraine  07/31/2015  . Morbid obesity ([facility]) 07/31/2015  . Family history of breast cancer in sister 07/21/2015  . 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 Status post gastric bypass for obesity 07/18/2015  . Knee osteoarthritis 07/18/2015  . Anxiety 03/20/2015  . Metatarsal stress fracture 03/08/2012  . KIDNEY DISEASE 02/25/2008  . FLANK PAIN, LEFT 02/25/2008  . ALLERGIC RHINITIS 01/26/2008    Past Surgical History:  Procedure Laterality Date  . ABDOMINAL HYSTERECTOMY    . CHOLECYSTECTOMY    . GASTRIC BYPASS    . TUBAL LIGATION    . TUBAL LIGATION       OB History   No obstetric history on file.     Family History  Problem Relation Age of Onset  . Diabetes Mother   . Hypertension Mother   . Diabetes Father   . Hypertension Father   . Cancer Sister   . Diabetes Brother   . Hypertension Brother   . Hyperlipidemia Brother     Social History   Tobacco Use  . Smoking status: Never Smoker  . Smokeless tobacco: Never Used  Substance Use Topics  . Alcohol use: Yes    Alcohol/week: 0.0 standard drinks    Comment: ocassional  . Drug use: No    Home Medications Prior to Admission medications   Medication Sig Start Date End Date Taking? Authorizing Provider  oxyCODONE (ROXICODONE) 5 MG immediate release tablet Take 1 tablet (5 mg total) by mouth every 6 (six) hours as needed for severe pain. 04/08/20  Yes Avarae Zwart, [place] N, PA-C  cetirizine (ZYRTEC) 10 MG tablet Take 1 tablet (10 mg total) by mouth daily.  09/03/18   Doristine Bosworth, MD  clonazePAM (KLONOPIN) 1 MG tablet Take 1 tablet (1 mg total) by mouth at bedtime as needed for anxiety. 01/27/19   Shade Flood, MD  dextromethorphan-guaiFENesin [facility] DM) 30-600 MG 12hr tablet Take 1 tablet by mouth 2 (two) times daily. 07/11/19   Janeece Agee, NP  diclofenac sodium (VOLTAREN) 1 % GEL Apply 4 g topically 4 (four) times daily. 10/07/17   Doristine Bosworth, MD  EPINEPHrine (EPIPEN 2-PAK) 0.3 mg/0.3 mL IJ SOAJ injection Inject  0.3 mLs (0.3 mg total) into the muscle as needed for anaphylaxis. 02/16/19   Shade Flood, MD  fluconazole (DIFLUCAN) 100 MG tablet TAKE 2 TABLETS BY MOUTH ON DAY 1 AND TAKE 1 TABLET BY MOUTH THEREAFTER UNTIL COMPLETED 03/05/20   Janalyn Harder, MD  FLUoxetine (PROZAC) 20 MG tablet Take 2 tablets (40 mg total) by mouth daily. 01/31/19   Shade Flood, MD  fluticasone (FLONASE) 50 MCG/ACT nasal spray Place 2 sprays into both nostrils daily. 07/11/19   Janeece Agee, NP  gabapentin (NEURONTIN) 100 MG capsule Take 1 capsule (100 mg total) by mouth at bedtime. 03/30/19   Shade Flood, MD  gabapentin (NEURONTIN) 300 MG capsule Take 2 capsules (600 mg total) by mouth at bedtime. 04/27/19   Shade Flood, MD  ipratropium (ATROVENT) 0.06 % nasal spray Place 1 spray into both nostrils 4 (four) times daily as needed for rhinitis. As needed for nasal congestion 03/30/19   Shade Flood, MD  meclizine (ANTIVERT) 25 MG tablet Take 1 tablet (25 mg total) by mouth 3 (three) times daily as needed for dizziness. 01/27/19   Shade Flood, MD  minocycline (MINOCIN) 50 MG capsule Take 1 capsule (50 mg total) by mouth 2 (two) times daily. 10/03/19   Janalyn Harder, MD  Multiple Vitamin (MULTIVITAMIN) tablet Take 1 tablet by mouth daily.    [provider]  ondansetron (ZOFRAN) 4 MG tablet Take 1 tablet (4 mg total) by mouth every 8 (eight) hours as needed for nausea or vomiting. 06/15/19   Janeece Agee, NP  pantoprazole (PROTONIX) 40 MG tablet TAKE 1 TABLET BY MOUTH ONCE DAILY AS NEEDED 02/22/20   Shade Flood, MD  rizatriptan (MAXALT-MLT) 5 MG disintegrating tablet DISSOLVE ONE TABLET BY MOUTH BY MOUTH AS NEEDED FOR MIGRAINE. MAY REPEAT IN 2 HOURS AS NEEDED. MAXIMUM OF 6 TABLETS IN 24 HOURS 01/27/19   Shade Flood, MD  Tazarotene (FABIOR) 0.1 % FOAM Use 2 nights a week, then increase as tolerated 10/03/19   Janalyn Harder, MD  tizanidine (ZANAFLEX) 2 MG capsule TAKE 1 CAPSULE(2 MG)  BY MOUTH EVERY 6 HOURS AS NEEDED FOR MUSCLE SPASMS 01/27/19   Shade Flood, MD  triamcinolone cream (KENALOG) 0.1 % Apply 1 application topically 2 (two) times daily. 07/21/19   Bing Neighbors, FNP    Allergies    Latex, Shellfish allergy, Aspirin, Nitrofurantoin, Other, Penicillins, Sulfa antibiotics, Sulfonamide derivatives, Codeine, and Penicillins  Review of Systems   Review of Systems  Constitutional: Negative for fever.  HENT: Negative for congestion.   Respiratory: Negative for cough.   Gastrointestinal: Negative for abdominal pain, nausea and vomiting.  Genitourinary: Negative for dysuria and frequency.  Musculoskeletal: Positive for back pain and myalgias.  Neurological: Negative for weakness and numbness.  All other systems reviewed and are negative.   Physical Exam Updated Vital Signs BP 113/78 (BP Location: Right Arm)   Pulse 60   Temp 98.5 F (36.9  C) (Oral)   Resp 18   Ht 5\' 5"  (1.651 m)   Wt 83 kg   SpO2 100%   BMI 30.45 kg/m   Physical Exam Vitals and nursing note reviewed.  Constitutional:      Appearance: She is well-developed and well-nourished. She is not ill-appearing.  HENT:     Head: Normocephalic and atraumatic.  Eyes:     Conjunctiva/sclera: Conjunctivae normal.  Cardiovascular:     Rate and Rhythm: Normal rate and regular rhythm.  Pulmonary:     Effort: Pulmonary effort is normal. No respiratory distress.     Breath sounds: Normal breath sounds.  Abdominal:     General: Bowel sounds are normal.     Palpations: Abdomen is soft.     Tenderness: There is no abdominal tenderness. There is no guarding or rebound.  Musculoskeletal:     Comments: Generalized TTP to lumbar region and upper buttock. Pain with movement. No focal midline TTP. No skin changes. No pain with int/ext rotation of hips.   Skin:    General: Skin is warm.  Neurological:     Mental Status: She is alert.     Comments: Normal tone.  5/5 strength in BLE including  strong and equal dorsiflexion/plantar flexion Sensory:  light touch normal in BLE extremities.   Gait: normal gait and balance CV: distal pulses palpable throughout    Psychiatric:        Mood and Affect: Mood and affect normal.        Behavior: Behavior normal.     ED Results / Procedures / Treatments   Labs (all labs ordered are listed, but only abnormal results are displayed) Labs Reviewed  URINALYSIS, ROUTINE W REFLEX MICROSCOPIC - Abnormal; Notable for the following components:      Result Value   Bilirubin Urine SMALL (*)    All other components within normal limits  SARS CORONAVIRUS 2 (TAT 6-24 HRS)    EKG None  Radiology No results found.  Procedures Procedures (including critical care time)  Medications Ordered in ED Medications  dexamethasone (DECADRON) injection 8 mg (8 mg Intramuscular Given 04/08/20 1558)    ED Course  I have reviewed the triage vital signs and the nursing notes.  Pertinent labs & imaging results that were available during my care of the patient were reviewed by me and considered in my medical decision making (see chart for details).    MDM Rules/Calculators/A&P                          Patient with low back pain since Friday. Sits at a desk for work, no recent trauma or strenuous activity. Presentation seems musculoskeletal in nature, reproducible with palpation and movement. She states if feels muscular in nature.  In the absence of trauma and with pain and TTP generalized in nature, do not believe imaging is necessary. Pt in agreement with this. Does not seem to be urinary source, negative UA. Small bilirubin but seems insignificant, no abdominal symptoms or TTP, hx of cholecystectomy, sx are bilateral.   No neurological deficits and normal neuro exam.  Patient can walk without difficulty.  No loss of bowel or bladder control.  No concern for cauda equina.  No fever. Did have potential COVID exposure, swab sent.   Patient treated with  dose of steroid here.  Will prescribe short course of pain medication, as patient is unable to take NSAIDs secondary to gastric bypass.  Also  recommended topical Voltaren gel OTC for additional pain relief.    [place] Controlled Substance reporting System queried  Discussed results, findings, treatment and follow up. Patient advised of return precautions. Patient verbalized understanding and agreed with plan.  Final Clinical Impression(s) / ED Diagnoses Final diagnoses:  Acute bilateral low back pain without sciatica    Rx / DC Orders ED Discharge Orders         Ordered    oxyCODONE (ROXICODONE) 5 MG immediate release tablet  Every 6 hours PRN        04/08/20 1604           Verniece Encarnacion, [place], [place] 04/08/20 1606    Charlynne Pander, MD 04/11/20 1048

## 2020-04-08 NOTE — Discharge Instructions (Addendum)
Please read instructions below.   Apply ice to your back for 20 minutes at a time.  You can also apply heat if this provides more relief.   You can take 1-2 tabs of oxycodone every hours as needed for pain.  Be aware this medication can make you drowsy; do not take while driving or drinking alcohol.   You can also try topical voltaren (diclofenac) and apply to area of pain as directed.  Follow-up with your primary care provider. Return to ER if new numbness or tingling in your arms or legs, inability to urinate, inability to hold your bowels, fever, or weakness in your extremities.

## 2020-04-08 NOTE — ED Triage Notes (Signed)
Pt complaining of bilateral flank with pressure in right abdomen. Denies urinary symptoms/N/V. Hx of kidney stones with stent

## 2020-04-08 NOTE — ED Notes (Signed)
Took Gabapentin yesterday, used heating pad to aid in relief of pain.

## 2020-04-08 NOTE — ED Notes (Signed)
COVID SWAB OBTAINED AND TO THE LAB 

## 2020-04-08 NOTE — ED Notes (Signed)
Here for evaluation of bilateral flank pain, onset this past Friday. Denies fevers, nausea, vomiting as well. Denies any issues with voiding.

## 2020-04-09 LAB — SARS CORONAVIRUS 2 (TAT 6-24 HRS): SARS Coronavirus 2: NEGATIVE

## 2020-04-14 DIAGNOSIS — B9689 Other specified bacterial agents as the cause of diseases classified elsewhere: Secondary | ICD-10-CM | POA: Diagnosis not present

## 2020-04-14 DIAGNOSIS — J019 Acute sinusitis, unspecified: Secondary | ICD-10-CM | POA: Diagnosis not present

## 2020-05-29 ENCOUNTER — Other Ambulatory Visit: Payer: Self-pay | Admitting: Dermatology

## 2020-08-14 ENCOUNTER — Other Ambulatory Visit: Payer: Self-pay | Admitting: Dermatology

## 2020-10-01 ENCOUNTER — Other Ambulatory Visit: Payer: Self-pay | Admitting: *Deleted

## 2020-10-01 MED ORDER — FLUCONAZOLE 100 MG PO TABS: ORAL_TABLET | ORAL | 4 refills | Status: DC

## 2020-10-01 MED ORDER — MINOCYCLINE HCL 100 MG PO CAPS
100.0000 mg | ORAL_CAPSULE | Freq: Two times a day (BID) | ORAL | 6 refills | Status: DC
Start: 2020-10-01 — End: 2021-09-26

## 2021-09-26 ENCOUNTER — Encounter: Payer: Self-pay | Admitting: Physician Assistant

## 2021-09-26 ENCOUNTER — Ambulatory Visit: Payer: Managed Care, Other (non HMO) | Admitting: Physician Assistant

## 2021-09-26 DIAGNOSIS — L811 Chloasma: Secondary | ICD-10-CM

## 2021-09-26 DIAGNOSIS — L700 Acne vulgaris: Secondary | ICD-10-CM | POA: Diagnosis not present

## 2021-09-26 MED ORDER — AMBULATORY NON FORMULARY MEDICATION: 11 refills | Status: AC

## 2021-09-26 MED ORDER — MUPIROCIN 2 % EX OINT
1.0000 | TOPICAL_OINTMENT | Freq: Every day | CUTANEOUS | 11 refills | Status: AC
Start: 2021-09-26 — End: ?

## 2021-09-26 MED ORDER — FLUCONAZOLE 100 MG PO TABS: ORAL_TABLET | ORAL | 11 refills | Status: AC

## 2021-09-26 MED ORDER — MINOCYCLINE HCL 100 MG PO CAPS
100.0000 mg | ORAL_CAPSULE | Freq: Two times a day (BID) | ORAL | 11 refills | Status: AC
Start: 2021-09-26 — End: ?

## 2021-10-21 ENCOUNTER — Encounter: Payer: Self-pay | Admitting: Physician Assistant

## 2021-10-21 NOTE — Progress Notes (Signed)
   Follow-Up Visit   Subjective  Jackie Ellis is a 56 y.o. female who presents for the following: Acne (Needs prescription refill on medication - minocycline, mupirocin, diflucan fading cream, ).   The following portions of the chart were reviewed this encounter and updated as appropriate:  Tobacco  Allergies  Meds  Problems  Med Hx  Surg Hx  Fam Hx      Objective  Well appearing patient in no apparent distress; mood and affect are within normal limits.  All skin waist up examined.  Head - Anterior (Face) Erythematous papules   Head - Anterior (Face) Reticulated hyperpigmented patches.    Assessment & Plan  Acne vulgaris Head - Anterior (Face)  minocycline (MINOCIN) 100 MG capsule - Head - Anterior (Face) Take 1 capsule (100 mg total) by mouth 2 (two) times daily.  fluconazole (DIFLUCAN) 100 MG tablet - Head - Anterior (Face) Take as directed  mupirocin ointment (BACTROBAN) 2 % - Head - Anterior (Face) Apply 1 Application topically daily.  Melasma Head - Anterior (Face)  AMBULATORY NON FORMULARY MEDICATION - Head - Anterior (Face) Medication Name: Compounded Hydroquinone 12%, Kojic Acid 6%, Vitamin C 1%, Niacinamide 2% Cream ([employer] [facility])    I, Elimelech Houseman, PA-C, have reviewed all documentation's for this visit.  The documentation on 10/21/21 for the exam, diagnosis, procedures and orders are all accurate and complete.
